# Patient Record
Sex: Male | Born: 1985
Health system: Southern US, Community
[De-identification: ages and names within clinical notes are randomized; demographics above are authoritative.]

## PROBLEM LIST (undated history)

## (undated) DIAGNOSIS — C8590 Non-Hodgkin lymphoma, unspecified, unspecified site: Secondary | ICD-10-CM

## (undated) HISTORY — PX: TONSILLECTOMY: SUR1361

---
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
## 2005-01-31 ENCOUNTER — Emergency Department (HOSPITAL_COMMUNITY): Admission: EM | Admit: 2005-01-31 | Discharge: 2005-01-31 | Payer: Self-pay | Admitting: Emergency Medicine

## 2005-03-08 ENCOUNTER — Encounter (HOSPITAL_COMMUNITY): Admission: RE | Admit: 2005-03-08 | Discharge: 2005-04-07 | Payer: Self-pay | Admitting: Family Medicine

## 2005-04-12 ENCOUNTER — Encounter (HOSPITAL_COMMUNITY): Admission: RE | Admit: 2005-04-12 | Discharge: 2005-05-12 | Payer: Self-pay | Admitting: Family Medicine

## 2010-06-04 ENCOUNTER — Encounter: Admission: RE | Admit: 2010-06-04 | Discharge: 2010-06-04 | Payer: Self-pay | Admitting: Otolaryngology

## 2010-06-29 ENCOUNTER — Ambulatory Visit: Payer: Self-pay | Admitting: Oncology

## 2010-07-01 LAB — COMPREHENSIVE METABOLIC PANEL
BUN: 14 mg/dL (ref 6–23)
CO2: 29 mEq/L (ref 19–32)
Calcium: 9.6 mg/dL (ref 8.4–10.5)
Chloride: 102 mEq/L (ref 96–112)
Creatinine, Ser: 1 mg/dL (ref 0.40–1.50)
Glucose, Bld: 81 mg/dL (ref 70–99)
Total Bilirubin: 0.5 mg/dL (ref 0.3–1.2)

## 2010-07-01 LAB — CBC WITH DIFFERENTIAL/PLATELET
Basophils Absolute: 0 10*3/uL (ref 0.0–0.1)
Eosinophils Absolute: 0.1 10*3/uL (ref 0.0–0.5)
HCT: 41.2 % (ref 38.4–49.9)
HGB: 14.4 g/dL (ref 13.0–17.1)
LYMPH%: 18.5 % (ref 14.0–49.0)
MCHC: 35 g/dL (ref 32.0–36.0)
MONO#: 0.5 10*3/uL (ref 0.1–0.9)
NEUT%: 70.5 % (ref 39.0–75.0)
Platelets: 281 10*3/uL (ref 140–400)
WBC: 5.8 10*3/uL (ref 4.0–10.3)
lymph#: 1.1 10*3/uL (ref 0.9–3.3)

## 2010-07-01 LAB — LACTATE DEHYDROGENASE: LDH: 140 U/L (ref 94–250)

## 2010-07-01 LAB — C-REACTIVE PROTEIN: CRP: 0.3 mg/dL (ref <0.6)

## 2010-07-09 ENCOUNTER — Ambulatory Visit (HOSPITAL_COMMUNITY): Admission: RE | Admit: 2010-07-09 | Discharge: 2010-07-09 | Payer: Self-pay | Admitting: Oncology

## 2010-07-12 ENCOUNTER — Ambulatory Visit (HOSPITAL_COMMUNITY): Admission: RE | Admit: 2010-07-12 | Discharge: 2010-07-12 | Payer: Self-pay | Admitting: Oncology

## 2010-07-13 ENCOUNTER — Ambulatory Visit: Admission: RE | Admit: 2010-07-13 | Discharge: 2010-07-13 | Payer: Self-pay | Admitting: Oncology

## 2010-07-13 ENCOUNTER — Encounter: Payer: Self-pay | Admitting: Oncology

## 2010-07-16 LAB — CBC WITH DIFFERENTIAL/PLATELET
Basophils Absolute: 0 10*3/uL (ref 0.0–0.1)
EOS%: 1.3 % (ref 0.0–7.0)
Eosinophils Absolute: 0.1 10*3/uL (ref 0.0–0.5)
HGB: 15.7 g/dL (ref 13.0–17.1)
MCV: 91.5 fL (ref 79.3–98.0)
MONO%: 8.7 % (ref 0.0–14.0)
NEUT#: 7.3 10*3/uL — ABNORMAL HIGH (ref 1.5–6.5)
RBC: 4.97 10*6/uL (ref 4.20–5.82)
RDW: 13.9 % (ref 11.0–14.6)
lymph#: 1.2 10*3/uL (ref 0.9–3.3)

## 2010-07-16 LAB — COMPREHENSIVE METABOLIC PANEL
AST: 53 U/L — ABNORMAL HIGH (ref 0–37)
Albumin: 4.8 g/dL (ref 3.5–5.2)
Alkaline Phosphatase: 59 U/L (ref 39–117)
BUN: 16 mg/dL (ref 6–23)
Calcium: 10.1 mg/dL (ref 8.4–10.5)
Chloride: 101 mEq/L (ref 96–112)
Glucose, Bld: 70 mg/dL (ref 70–99)
Potassium: 4.6 mEq/L (ref 3.5–5.3)
Sodium: 140 mEq/L (ref 135–145)
Total Protein: 7.2 g/dL (ref 6.0–8.3)

## 2010-07-26 ENCOUNTER — Encounter: Payer: Self-pay | Admitting: Oncology

## 2010-07-26 ENCOUNTER — Inpatient Hospital Stay (HOSPITAL_COMMUNITY): Admission: RE | Admit: 2010-07-26 | Discharge: 2010-08-02 | Payer: Self-pay | Admitting: Internal Medicine

## 2010-07-26 ENCOUNTER — Ambulatory Visit: Payer: Self-pay | Admitting: Vascular Surgery

## 2010-07-27 ENCOUNTER — Ambulatory Visit: Payer: Self-pay | Admitting: Oncology

## 2010-07-29 ENCOUNTER — Ambulatory Visit: Payer: Self-pay | Admitting: Infectious Diseases

## 2010-07-29 ENCOUNTER — Ambulatory Visit: Payer: Self-pay | Admitting: Oncology

## 2010-08-02 ENCOUNTER — Encounter: Payer: Self-pay | Admitting: Infectious Diseases

## 2010-08-13 LAB — COMPREHENSIVE METABOLIC PANEL
ALT: 47 U/L (ref 0–53)
BUN: 9 mg/dL (ref 6–23)
CO2: 28 mEq/L (ref 19–32)
Creatinine, Ser: 0.73 mg/dL (ref 0.40–1.50)
Total Bilirubin: 0.6 mg/dL (ref 0.3–1.2)

## 2010-08-13 LAB — CBC WITH DIFFERENTIAL/PLATELET
BASO%: 1.1 % (ref 0.0–2.0)
Basophils Absolute: 0.1 10*3/uL (ref 0.0–0.1)
EOS%: 1.5 % (ref 0.0–7.0)
HCT: 34.1 % — ABNORMAL LOW (ref 38.4–49.9)
LYMPH%: 33.5 % (ref 14.0–49.0)
MCH: 32.1 pg (ref 27.2–33.4)
MCHC: 34.8 g/dL (ref 32.0–36.0)
MONO#: 0.5 10*3/uL (ref 0.1–0.9)
NEUT%: 55.8 % (ref 39.0–75.0)
Platelets: 315 10*3/uL (ref 140–400)

## 2010-08-16 DIAGNOSIS — R740 Nonspecific elevation of levels of transaminase and lactic acid dehydrogenase [LDH]: Secondary | ICD-10-CM

## 2010-08-16 DIAGNOSIS — A419 Sepsis, unspecified organism: Secondary | ICD-10-CM | POA: Insufficient documentation

## 2010-08-16 DIAGNOSIS — R7401 Elevation of levels of liver transaminase levels: Secondary | ICD-10-CM | POA: Insufficient documentation

## 2010-08-16 DIAGNOSIS — M25519 Pain in unspecified shoulder: Secondary | ICD-10-CM | POA: Insufficient documentation

## 2010-08-18 ENCOUNTER — Encounter: Payer: Self-pay | Admitting: Infectious Diseases

## 2010-08-19 ENCOUNTER — Ambulatory Visit: Payer: Self-pay | Admitting: Infectious Diseases

## 2010-08-19 ENCOUNTER — Encounter (INDEPENDENT_AMBULATORY_CARE_PROVIDER_SITE_OTHER): Payer: Self-pay | Admitting: *Deleted

## 2010-08-20 ENCOUNTER — Encounter: Payer: Self-pay | Admitting: Infectious Diseases

## 2010-08-20 LAB — CBC WITH DIFFERENTIAL/PLATELET
Basophils Absolute: 0 10*3/uL (ref 0.0–0.1)
EOS%: 2.4 % (ref 0.0–7.0)
HCT: 34.9 % — ABNORMAL LOW (ref 38.4–49.9)
HGB: 12.3 g/dL — ABNORMAL LOW (ref 13.0–17.1)
MCH: 32.3 pg (ref 27.2–33.4)
MCV: 92 fL (ref 79.3–98.0)
MONO%: 9 % (ref 0.0–14.0)
NEUT%: 45.5 % (ref 39.0–75.0)

## 2010-08-20 LAB — COMPREHENSIVE METABOLIC PANEL
AST: 26 U/L (ref 0–37)
Alkaline Phosphatase: 90 U/L (ref 39–117)
BUN: 11 mg/dL (ref 6–23)
Creatinine, Ser: 0.87 mg/dL (ref 0.40–1.50)
Total Bilirubin: 0.6 mg/dL (ref 0.3–1.2)

## 2010-08-23 ENCOUNTER — Encounter: Payer: Self-pay | Admitting: Internal Medicine

## 2010-09-01 ENCOUNTER — Ambulatory Visit: Payer: Self-pay | Admitting: Oncology

## 2010-09-03 LAB — COMPREHENSIVE METABOLIC PANEL
ALT: 36 U/L (ref 0–53)
BUN: 9 mg/dL (ref 6–23)
CO2: 31 mEq/L (ref 19–32)
Calcium: 9.6 mg/dL (ref 8.4–10.5)
Chloride: 104 mEq/L (ref 96–112)
Creatinine, Ser: 0.84 mg/dL (ref 0.40–1.50)

## 2010-09-03 LAB — CBC WITH DIFFERENTIAL/PLATELET
Basophils Absolute: 0 10*3/uL (ref 0.0–0.1)
EOS%: 4.1 % (ref 0.0–7.0)
Eosinophils Absolute: 0.1 10*3/uL (ref 0.0–0.5)
LYMPH%: 57 % — ABNORMAL HIGH (ref 14.0–49.0)
MCH: 32.9 pg (ref 27.2–33.4)
MCV: 93.6 fL (ref 79.3–98.0)
MONO%: 18.6 % — ABNORMAL HIGH (ref 0.0–14.0)
NEUT#: 0.5 10*3/uL — ABNORMAL LOW (ref 1.5–6.5)
Platelets: 266 10*3/uL (ref 140–400)
RBC: 4.04 10*6/uL — ABNORMAL LOW (ref 4.20–5.82)
RDW: 16.5 % — ABNORMAL HIGH (ref 11.0–14.6)
WBC: 2.6 10*3/uL — ABNORMAL LOW (ref 4.0–10.3)

## 2010-09-24 LAB — CBC WITH DIFFERENTIAL/PLATELET
BASO%: 0.6 % (ref 0.0–2.0)
EOS%: 1.5 % (ref 0.0–7.0)
MCH: 33 pg (ref 27.2–33.4)
MCV: 94.2 fL (ref 79.3–98.0)
MONO%: 8.8 % (ref 0.0–14.0)
RBC: 4.26 10*6/uL (ref 4.20–5.82)
RDW: 14.8 % — ABNORMAL HIGH (ref 11.0–14.6)
lymph#: 1.7 10*3/uL (ref 0.9–3.3)

## 2010-09-24 LAB — COMPREHENSIVE METABOLIC PANEL
ALT: 24 U/L (ref 0–53)
AST: 26 U/L (ref 0–37)
Albumin: 4.3 g/dL (ref 3.5–5.2)
Alkaline Phosphatase: 43 U/L (ref 39–117)
Chloride: 106 mEq/L (ref 96–112)
Potassium: 4.1 mEq/L (ref 3.5–5.3)
Sodium: 141 mEq/L (ref 135–145)
Total Protein: 6.9 g/dL (ref 6.0–8.3)

## 2010-10-04 ENCOUNTER — Ambulatory Visit: Payer: Self-pay | Admitting: Oncology

## 2010-10-05 LAB — CBC WITH DIFFERENTIAL/PLATELET
BASO%: 0.8 % (ref 0.0–2.0)
Basophils Absolute: 0 10*3/uL (ref 0.0–0.1)
EOS%: 1.6 % (ref 0.0–7.0)
HGB: 12.8 g/dL — ABNORMAL LOW (ref 13.0–17.1)
MCH: 33.6 pg — ABNORMAL HIGH (ref 27.2–33.4)
MCHC: 35.7 g/dL (ref 32.0–36.0)
MCV: 94.2 fL (ref 79.3–98.0)
MONO%: 10.2 % (ref 0.0–14.0)
RDW: 13.5 % (ref 11.0–14.6)

## 2010-10-05 LAB — COMPREHENSIVE METABOLIC PANEL
ALT: 22 U/L (ref 0–53)
AST: 25 U/L (ref 0–37)
Albumin: 4.2 g/dL (ref 3.5–5.2)
Alkaline Phosphatase: 38 U/L — ABNORMAL LOW (ref 39–117)
BUN: 8 mg/dL (ref 6–23)
Creatinine, Ser: 0.9 mg/dL (ref 0.40–1.50)
Potassium: 3.8 mEq/L (ref 3.5–5.3)

## 2010-10-22 LAB — COMPREHENSIVE METABOLIC PANEL
ALT: 28 U/L (ref 0–53)
AST: 31 U/L (ref 0–37)
Albumin: 4.6 g/dL (ref 3.5–5.2)
Alkaline Phosphatase: 44 U/L (ref 39–117)
BUN: 10 mg/dL (ref 6–23)
CO2: 28 mEq/L (ref 19–32)
Calcium: 9.7 mg/dL (ref 8.4–10.5)
Chloride: 105 mEq/L (ref 96–112)
Creatinine, Ser: 1.02 mg/dL (ref 0.40–1.50)
Glucose, Bld: 71 mg/dL (ref 70–99)
Potassium: 4.4 mEq/L (ref 3.5–5.3)
Sodium: 143 mEq/L (ref 135–145)
Total Bilirubin: 0.6 mg/dL (ref 0.3–1.2)
Total Protein: 6.8 g/dL (ref 6.0–8.3)

## 2010-10-22 LAB — CBC WITH DIFFERENTIAL/PLATELET
BASO%: 3 % — ABNORMAL HIGH (ref 0.0–2.0)
Basophils Absolute: 0.1 10*3/uL (ref 0.0–0.1)
EOS%: 3 % (ref 0.0–7.0)
Eosinophils Absolute: 0.1 10*3/uL (ref 0.0–0.5)
HCT: 38.9 % (ref 38.4–49.9)
HGB: 14 g/dL (ref 13.0–17.1)
LYMPH%: 43.6 % (ref 14.0–49.0)
MCH: 32.3 pg (ref 27.2–33.4)
MCHC: 36 g/dL (ref 32.0–36.0)
MCV: 89.6 fL (ref 79.3–98.0)
MONO#: 0.8 10*3/uL (ref 0.1–0.9)
MONO%: 26.4 % — ABNORMAL HIGH (ref 0.0–14.0)
NEUT#: 0.7 10*3/uL — ABNORMAL LOW (ref 1.5–6.5)
NEUT%: 24 % — ABNORMAL LOW (ref 39.0–75.0)
Platelets: 272 10*3/uL (ref 140–400)
RBC: 4.34 10*6/uL (ref 4.20–5.82)
RDW: 13.9 % (ref 11.0–14.6)
WBC: 3 10*3/uL — ABNORMAL LOW (ref 4.0–10.3)
lymph#: 1.3 10*3/uL (ref 0.9–3.3)

## 2010-10-28 ENCOUNTER — Ambulatory Visit
Admission: RE | Admit: 2010-10-28 | Discharge: 2010-11-16 | Payer: Self-pay | Source: Home / Self Care | Attending: Radiation Oncology | Admitting: Radiation Oncology

## 2010-11-05 ENCOUNTER — Ambulatory Visit (HOSPITAL_COMMUNITY)
Admission: RE | Admit: 2010-11-05 | Discharge: 2010-11-05 | Payer: Self-pay | Source: Home / Self Care | Attending: Radiation Oncology | Admitting: Radiation Oncology

## 2010-11-05 ENCOUNTER — Ambulatory Visit (HOSPITAL_COMMUNITY): Admission: RE | Admit: 2010-11-05 | Payer: Self-pay | Source: Home / Self Care | Admitting: Internal Medicine

## 2010-11-16 NOTE — Miscellaneous (Signed)
Summary: HIPAA Restrictions  HIPAA Restrictions   Imported By: Florinda Marker 08/20/2010 09:37:22  _____________________________________________________________________  External Attachment:    Type:   Image     Comment:   External Document

## 2010-11-16 NOTE — Miscellaneous (Signed)
Summary: Problems, Medications and Allergies updated  Clinical Lists Changes  Problems: Added new problem of HODGKIN'S LYMPHOMA, HX OF (ICD-V10.72) Added new problem of SEPSIS (ICD-995.91) Added new problem of SHOULDER PAIN (ICD-719.41) Added new problem of TRANSAMINASES, SERUM, ELEVATED (ICD-790.4) Medications: Added new medication of VANCOMYCIN HCL 1000 MG SOLR (VANCOMYCIN HCL) per protocol Added new medication of ZOSYN 2-0.25 GM SOLR (PIPERACILLIN SOD-TAZOBACTAM SO) per protocol Observations: Added new observation of NKA: T (08/19/2010 10:12)

## 2010-11-16 NOTE — Miscellaneous (Signed)
Summary: [facility]: Verbal Orders  [facility]: Verbal Orders   Imported By: Florinda Marker 08/25/2010 15:57:56  _____________________________________________________________________  External Attachment:    Type:   Image     Comment:   External Document

## 2010-11-17 ENCOUNTER — Ambulatory Visit: Payer: Self-pay | Admitting: Radiation Oncology

## 2010-11-19 ENCOUNTER — Ambulatory Visit: Payer: 59 | Attending: Radiation Oncology | Admitting: Radiation Oncology

## 2010-11-19 DIAGNOSIS — K209 Esophagitis, unspecified without bleeding: Secondary | ICD-10-CM | POA: Insufficient documentation

## 2010-11-19 DIAGNOSIS — Z510 Encounter for antineoplastic radiation therapy: Secondary | ICD-10-CM | POA: Insufficient documentation

## 2010-11-19 DIAGNOSIS — C8198 Hodgkin lymphoma, unspecified, lymph nodes of multiple sites: Secondary | ICD-10-CM | POA: Insufficient documentation

## 2010-11-19 DIAGNOSIS — R609 Edema, unspecified: Secondary | ICD-10-CM | POA: Insufficient documentation

## 2010-11-19 NOTE — Medication Information (Signed)
Summary: [facility]: RX  [facility]: RX   Imported By: Florinda Marker 08/10/2010 12:04:56  _____________________________________________________________________  External Attachment:    Type:   Image     Comment:   External Document

## 2010-12-29 LAB — COMPREHENSIVE METABOLIC PANEL
ALT: 116 U/L — ABNORMAL HIGH (ref 0–53)
AST: 122 U/L — ABNORMAL HIGH (ref 0–37)
AST: 97 U/L — ABNORMAL HIGH (ref 0–37)
Albumin: 2.1 g/dL — ABNORMAL LOW (ref 3.5–5.2)
BUN: 12 mg/dL (ref 6–23)
CO2: 28 mEq/L (ref 19–32)
Calcium: 8.7 mg/dL (ref 8.4–10.5)
Calcium: 8.8 mg/dL (ref 8.4–10.5)
Chloride: 103 mEq/L (ref 96–112)
Creatinine, Ser: 0.91 mg/dL (ref 0.4–1.5)
GFR calc Af Amer: >60 mL/min (ref >60)
GFR calc Af Amer: >60 mL/min (ref >60)
GFR calc non Af Amer: >60 mL/min (ref >60)
Potassium: 4.2 mEq/L (ref 3.5–5.1)
Sodium: 138 mEq/L (ref 135–145)
Total Protein: 6.4 g/dL (ref 6.0–8.3)

## 2010-12-29 LAB — DIFFERENTIAL
Basophils Absolute: 0.1 10*3/uL (ref 0.0–0.1)
Eosinophils Absolute: 0.1 10*3/uL (ref 0.0–0.7)
Eosinophils Relative: 1 % (ref 0–5)
Lymphocytes Relative: 16 % (ref 12–46)
Lymphs Abs: 1.2 10*3/uL (ref 0.7–4.0)
Lymphs Abs: 1.6 10*3/uL (ref 0.7–4.0)
Monocytes Absolute: 0.9 10*3/uL (ref 0.1–1.0)
Monocytes Relative: 10 % (ref 3–12)
Monocytes Relative: 9 % (ref 3–12)
Neutro Abs: 7.5 10*3/uL (ref 1.7–7.7)

## 2010-12-29 LAB — CBC
Hemoglobin: 11.2 g/dL — ABNORMAL LOW (ref 13.0–17.0)
MCH: 31.8 pg (ref 26.0–34.0)
MCHC: 34.6 g/dL (ref 30.0–36.0)
MCV: 92.5 fL (ref 78.0–100.0)
Platelets: 506 10*3/uL — ABNORMAL HIGH (ref 150–400)
RBC: 3.49 MIL/uL — ABNORMAL LOW (ref 4.22–5.81)
RDW: 13.9 % (ref 11.5–15.5)
WBC: 7.7 10*3/uL (ref 4.0–10.5)

## 2010-12-29 LAB — MAGNESIUM
Magnesium: 2.2 mg/dL (ref 1.5–2.5)
Magnesium: 2.2 mg/dL (ref 1.5–2.5)

## 2010-12-29 LAB — HEPATITIS PANEL, ACUTE: Hep A IgM: NEGATIVE

## 2010-12-29 LAB — HEPATIC FUNCTION PANEL
ALT: 105 U/L — ABNORMAL HIGH (ref 0–53)
AST: 94 U/L — ABNORMAL HIGH (ref 0–37)
Total Protein: 6.2 g/dL (ref 6.0–8.3)

## 2010-12-30 LAB — COMPREHENSIVE METABOLIC PANEL
ALT: 106 U/L — ABNORMAL HIGH (ref 0–53)
ALT: 92 U/L — ABNORMAL HIGH (ref 0–53)
ALT: 94 U/L — ABNORMAL HIGH (ref 0–53)
AST: 128 U/L — ABNORMAL HIGH (ref 0–37)
AST: 78 U/L — ABNORMAL HIGH (ref 0–37)
AST: 88 U/L — ABNORMAL HIGH (ref 0–37)
AST: 94 U/L — ABNORMAL HIGH (ref 0–37)
Albumin: 1.9 g/dL — ABNORMAL LOW (ref 3.5–5.2)
Albumin: 2.3 g/dL — ABNORMAL LOW (ref 3.5–5.2)
Alkaline Phosphatase: 102 U/L (ref 39–117)
Alkaline Phosphatase: 109 U/L (ref 39–117)
Alkaline Phosphatase: 119 U/L — ABNORMAL HIGH (ref 39–117)
Alkaline Phosphatase: 133 U/L — ABNORMAL HIGH (ref 39–117)
BUN: 10 mg/dL (ref 6–23)
BUN: 21 mg/dL (ref 6–23)
CO2: 26 mEq/L (ref 19–32)
CO2: 27 mEq/L (ref 19–32)
CO2: 32 mEq/L (ref 19–32)
Calcium: 8.1 mg/dL — ABNORMAL LOW (ref 8.4–10.5)
Calcium: 8.1 mg/dL — ABNORMAL LOW (ref 8.4–10.5)
Calcium: 8.2 mg/dL — ABNORMAL LOW (ref 8.4–10.5)
Calcium: 8.3 mg/dL — ABNORMAL LOW (ref 8.4–10.5)
Chloride: 100 mEq/L (ref 96–112)
Chloride: 101 mEq/L (ref 96–112)
Chloride: 102 mEq/L (ref 96–112)
Chloride: 89 mEq/L — ABNORMAL LOW (ref 96–112)
Creatinine, Ser: 0.92 mg/dL (ref 0.4–1.5)
Creatinine, Ser: 1.24 mg/dL (ref 0.4–1.5)
GFR calc Af Amer: >60 mL/min (ref >60)
GFR calc Af Amer: >60 mL/min (ref >60)
GFR calc Af Amer: >60 mL/min (ref >60)
GFR calc Af Amer: >60 mL/min (ref >60)
GFR calc non Af Amer: >60 mL/min (ref >60)
GFR calc non Af Amer: >60 mL/min (ref >60)
GFR calc non Af Amer: >60 mL/min (ref >60)
Glucose, Bld: 104 mg/dL — ABNORMAL HIGH (ref 70–99)
Glucose, Bld: 114 mg/dL — ABNORMAL HIGH (ref 70–99)
Potassium: 3.4 mEq/L — ABNORMAL LOW (ref 3.5–5.1)
Potassium: 4 mEq/L (ref 3.5–5.1)
Potassium: 4.5 mEq/L (ref 3.5–5.1)
Sodium: 131 mEq/L — ABNORMAL LOW (ref 135–145)
Sodium: 134 mEq/L — ABNORMAL LOW (ref 135–145)
Sodium: 137 mEq/L (ref 135–145)
Total Bilirubin: 1.4 mg/dL — ABNORMAL HIGH (ref 0.3–1.2)
Total Bilirubin: 1.7 mg/dL — ABNORMAL HIGH (ref 0.3–1.2)
Total Bilirubin: 1.9 mg/dL — ABNORMAL HIGH (ref 0.3–1.2)
Total Bilirubin: 2.4 mg/dL — ABNORMAL HIGH (ref 0.3–1.2)
Total Protein: 5.8 g/dL — ABNORMAL LOW (ref 6.0–8.3)

## 2010-12-30 LAB — CULTURE, BLOOD (ROUTINE X 2)

## 2010-12-30 LAB — CBC
HCT: 29.3 % — ABNORMAL LOW (ref 39.0–52.0)
HCT: 29.4 % — ABNORMAL LOW (ref 39.0–52.0)
HCT: 37.1 % — ABNORMAL LOW (ref 39.0–52.0)
Hemoglobin: 10.3 g/dL — ABNORMAL LOW (ref 13.0–17.0)
Hemoglobin: 10.3 g/dL — ABNORMAL LOW (ref 13.0–17.0)
Hemoglobin: 12.9 g/dL — ABNORMAL LOW (ref 13.0–17.0)
MCH: 32.1 pg (ref 26.0–34.0)
MCHC: 34.8 g/dL (ref 30.0–36.0)
MCHC: 35 g/dL (ref 30.0–36.0)
MCHC: 35 g/dL (ref 30.0–36.0)
MCV: 91.6 fL (ref 78.0–100.0)
MCV: 91.7 fL (ref 78.0–100.0)
Platelets: 224 10*3/uL (ref 150–400)
Platelets: 254 10*3/uL (ref 150–400)
Platelets: 417 10*3/uL — ABNORMAL HIGH (ref 150–400)
RBC: 3.16 MIL/uL — ABNORMAL LOW (ref 4.22–5.81)
RBC: 3.21 MIL/uL — ABNORMAL LOW (ref 4.22–5.81)
RBC: 3.23 MIL/uL — ABNORMAL LOW (ref 4.22–5.81)
RBC: 4.59 MIL/uL (ref 4.22–5.81)
RDW: 13.4 % (ref 11.5–15.5)
RDW: 13.5 % (ref 11.5–15.5)
RDW: 13.6 % (ref 11.5–15.5)
WBC: 6.7 10*3/uL (ref 4.0–10.5)
WBC: 8.9 10*3/uL (ref 4.0–10.5)
WBC: 9.3 10*3/uL (ref 4.0–10.5)
WBC: 9.5 10*3/uL (ref 4.0–10.5)

## 2010-12-30 LAB — DIFFERENTIAL
Band Neutrophils: 0 % (ref 0–10)
Basophils Relative: 0 % (ref 0–1)
Basophils Relative: 0 % (ref 0–1)
Blasts: 0 %
Eosinophils Absolute: 0 10*3/uL (ref 0.0–0.7)
Eosinophils Absolute: 0.1 10*3/uL (ref 0.0–0.7)
Eosinophils Relative: 0 % (ref 0–5)
Eosinophils Relative: 0 % (ref 0–5)
Eosinophils Relative: 1 % (ref 0–5)
Lymphocytes Relative: 12 % (ref 12–46)
Lymphocytes Relative: 15 % (ref 12–46)
Lymphs Abs: 0.9 10*3/uL (ref 0.7–4.0)
Lymphs Abs: 1.1 10*3/uL (ref 0.7–4.0)
Lymphs Abs: 1.4 10*3/uL (ref 0.7–4.0)
Metamyelocytes Relative: 0 %
Monocytes Absolute: 2.5 10*3/uL — ABNORMAL HIGH (ref 0.1–1.0)
Monocytes Relative: 23 % — ABNORMAL HIGH (ref 3–12)
Monocytes Relative: 26 % — ABNORMAL HIGH (ref 3–12)
Neutro Abs: 5.8 10*3/uL (ref 1.7–7.7)
WBC Morphology: INCREASED
nRBC: 0 /100 WBC

## 2010-12-30 LAB — URINALYSIS, ROUTINE W REFLEX MICROSCOPIC
Glucose, UA: NEGATIVE mg/dL
Specific Gravity, Urine: 1.025 (ref 1.005–1.030)
pH: 5.5 (ref 5.0–8.0)

## 2010-12-30 LAB — GLUCOSE, CAPILLARY
Glucose-Capillary: 94 mg/dL (ref 70–99)
Glucose-Capillary: 98 mg/dL (ref 70–99)

## 2010-12-30 LAB — URINE MICROSCOPIC-ADD ON

## 2010-12-30 LAB — CARDIAC PANEL(CRET KIN+CKTOT+MB+TROPI)
CK, MB: 0.9 ng/mL (ref 0.3–4.0)
CK, MB: 1.5 ng/mL (ref 0.3–4.0)
Total CK: 67 U/L (ref 7–232)
Troponin I: 0.03 ng/mL (ref 0.00–0.06)

## 2010-12-30 LAB — MRSA PCR SCREENING: MRSA by PCR: NEGATIVE

## 2010-12-30 LAB — PROTIME-INR
INR: 1.52 — ABNORMAL HIGH (ref 0.00–1.49)
INR: 1.54 — ABNORMAL HIGH (ref 0.00–1.49)
Prothrombin Time: 19.3 seconds — ABNORMAL HIGH (ref 11.6–15.2)

## 2010-12-30 LAB — CATH TIP CULTURE: Culture: >100

## 2010-12-30 LAB — DIC (DISSEMINATED INTRAVASCULAR COAGULATION)PANEL
Smear Review: NONE SEEN
aPTT: 46 seconds — ABNORMAL HIGH (ref 24–37)

## 2010-12-30 LAB — POCT CARDIAC MARKERS
CKMB, poc: <1 ng/mL — ABNORMAL LOW (ref 1.0–8.0)
Myoglobin, poc: 222 ng/mL (ref 12–200)
Troponin i, poc: <0.05 ng/mL (ref 0.00–0.09)

## 2010-12-30 LAB — VANCOMYCIN, TROUGH: Vancomycin Tr: 9.6 ug/mL — ABNORMAL LOW (ref 10.0–20.0)

## 2010-12-30 LAB — MAGNESIUM: Magnesium: 2.7 mg/dL — ABNORMAL HIGH (ref 1.5–2.5)

## 2011-01-28 ENCOUNTER — Ambulatory Visit: Payer: 59 | Attending: Radiation Oncology | Admitting: Radiation Oncology

## 2011-02-04 ENCOUNTER — Encounter (HOSPITAL_BASED_OUTPATIENT_CLINIC_OR_DEPARTMENT_OTHER): Payer: 59 | Admitting: Oncology

## 2011-02-04 ENCOUNTER — Other Ambulatory Visit: Payer: Self-pay | Admitting: Oncology

## 2011-02-04 DIAGNOSIS — C8121 Mixed cellularity classical Hodgkin lymphoma, lymph nodes of head, face, and neck: Secondary | ICD-10-CM

## 2011-02-04 DIAGNOSIS — B029 Zoster without complications: Secondary | ICD-10-CM

## 2011-02-04 DIAGNOSIS — C8198 Hodgkin lymphoma, unspecified, lymph nodes of multiple sites: Secondary | ICD-10-CM

## 2011-02-04 LAB — CBC WITH DIFFERENTIAL/PLATELET
BASO%: 1.3 % (ref 0.0–2.0)
Basophils Absolute: 0.1 10*3/uL (ref 0.0–0.1)
EOS%: 5.3 % (ref 0.0–7.0)
HGB: 14.9 g/dL (ref 13.0–17.1)
MCH: 32.5 pg (ref 27.2–33.4)
MCHC: 35.4 g/dL (ref 32.0–36.0)
MONO#: 0.4 10*3/uL (ref 0.1–0.9)
RDW: 13.4 % (ref 11.0–14.6)
WBC: 4 10*3/uL (ref 4.0–10.3)
lymph#: 0.6 10*3/uL — ABNORMAL LOW (ref 0.9–3.3)

## 2011-02-04 LAB — COMPREHENSIVE METABOLIC PANEL
ALT: 17 U/L (ref 0–53)
AST: 29 U/L (ref 0–37)
Albumin: 5 g/dL (ref 3.5–5.2)
CO2: 26 mEq/L (ref 19–32)
Calcium: 10.2 mg/dL (ref 8.4–10.5)
Chloride: 104 mEq/L (ref 96–112)
Potassium: 4.3 mEq/L (ref 3.5–5.3)

## 2011-05-06 ENCOUNTER — Other Ambulatory Visit: Payer: Self-pay | Admitting: Medical

## 2011-05-06 ENCOUNTER — Other Ambulatory Visit: Payer: Self-pay | Admitting: Oncology

## 2011-05-06 ENCOUNTER — Encounter (HOSPITAL_BASED_OUTPATIENT_CLINIC_OR_DEPARTMENT_OTHER): Payer: 59 | Admitting: Oncology

## 2011-05-06 DIAGNOSIS — C8190 Hodgkin lymphoma, unspecified, unspecified site: Secondary | ICD-10-CM

## 2011-05-06 DIAGNOSIS — C8121 Mixed cellularity classical Hodgkin lymphoma, lymph nodes of head, face, and neck: Secondary | ICD-10-CM

## 2011-05-06 DIAGNOSIS — C8198 Hodgkin lymphoma, unspecified, lymph nodes of multiple sites: Secondary | ICD-10-CM

## 2011-05-06 LAB — COMPREHENSIVE METABOLIC PANEL
ALT: 17 U/L (ref 0–53)
AST: 24 U/L (ref 0–37)
Calcium: 9.9 mg/dL (ref 8.4–10.5)
Chloride: 99 mEq/L (ref 96–112)
Creatinine, Ser: 1.12 mg/dL (ref 0.50–1.35)
Potassium: 4.6 mEq/L (ref 3.5–5.3)
Sodium: 142 mEq/L (ref 135–145)

## 2011-05-06 LAB — CBC WITH DIFFERENTIAL/PLATELET
BASO%: 0.8 % (ref 0.0–2.0)
EOS%: 2.4 % (ref 0.0–7.0)
MCH: 33.7 pg — ABNORMAL HIGH (ref 27.2–33.4)
MCHC: 36.1 g/dL — ABNORMAL HIGH (ref 32.0–36.0)
RBC: 4.58 10*6/uL (ref 4.20–5.82)
RDW: 12.5 % (ref 11.0–14.6)
lymph#: 0.7 10*3/uL — ABNORMAL LOW (ref 0.9–3.3)

## 2011-07-29 ENCOUNTER — Ambulatory Visit
Admission: RE | Admit: 2011-07-29 | Discharge: 2011-07-29 | Disposition: A | Discharge status: Home / Self Care | Payer: BC Managed Care – PPO | Source: Ambulatory Visit | Attending: Radiation Oncology | Admitting: Radiation Oncology

## 2011-08-05 ENCOUNTER — Encounter (HOSPITAL_BASED_OUTPATIENT_CLINIC_OR_DEPARTMENT_OTHER): Payer: BC Managed Care – PPO | Admitting: Oncology

## 2011-08-05 ENCOUNTER — Other Ambulatory Visit: Payer: Self-pay | Admitting: Oncology

## 2011-08-05 ENCOUNTER — Other Ambulatory Visit (HOSPITAL_COMMUNITY): Payer: 59

## 2011-08-05 DIAGNOSIS — C8198 Hodgkin lymphoma, unspecified, lymph nodes of multiple sites: Secondary | ICD-10-CM

## 2011-08-05 DIAGNOSIS — B029 Zoster without complications: Secondary | ICD-10-CM

## 2011-08-05 DIAGNOSIS — C8121 Mixed cellularity classical Hodgkin lymphoma, lymph nodes of head, face, and neck: Secondary | ICD-10-CM

## 2011-08-05 LAB — CBC WITH DIFFERENTIAL/PLATELET
Basophils Absolute: 0 10*3/uL (ref 0.0–0.1)
EOS%: 2 % (ref 0.0–7.0)
Eosinophils Absolute: 0.1 10*3/uL (ref 0.0–0.5)
HGB: 15.3 g/dL (ref 13.0–17.1)
MCV: 93.9 fL (ref 79.3–98.0)
MONO%: 13.2 % (ref 0.0–14.0)
NEUT#: 3 10*3/uL (ref 1.5–6.5)
RBC: 4.54 10*6/uL (ref 4.20–5.82)
RDW: 12.6 % (ref 11.0–14.6)
lymph#: 0.9 10*3/uL (ref 0.9–3.3)

## 2011-08-05 LAB — COMPREHENSIVE METABOLIC PANEL
AST: 29 U/L (ref 0–37)
Albumin: 4.3 g/dL (ref 3.5–5.2)
Alkaline Phosphatase: 49 U/L (ref 39–117)
Calcium: 9.8 mg/dL (ref 8.4–10.5)
Chloride: 103 mEq/L (ref 96–112)
Glucose, Bld: 72 mg/dL (ref 70–99)
Potassium: 3.9 mEq/L (ref 3.5–5.3)
Sodium: 139 mEq/L (ref 135–145)
Total Protein: 6.9 g/dL (ref 6.0–8.3)

## 2011-08-12 ENCOUNTER — Encounter (HOSPITAL_BASED_OUTPATIENT_CLINIC_OR_DEPARTMENT_OTHER): Payer: BC Managed Care – PPO | Admitting: Oncology

## 2011-08-12 DIAGNOSIS — C8198 Hodgkin lymphoma, unspecified, lymph nodes of multiple sites: Secondary | ICD-10-CM

## 2011-08-12 DIAGNOSIS — C8121 Mixed cellularity classical Hodgkin lymphoma, lymph nodes of head, face, and neck: Secondary | ICD-10-CM

## 2011-08-25 ENCOUNTER — Ambulatory Visit: Payer: BC Managed Care – PPO

## 2011-08-25 ENCOUNTER — Ambulatory Visit: Payer: BC Managed Care – PPO | Admitting: Physician Assistant

## 2011-10-27 ENCOUNTER — Telehealth: Payer: Self-pay | Admitting: Oncology

## 2011-10-27 NOTE — Telephone Encounter (Signed)
Returned pt's call re needing pet scan appt for 1/25. R/s scan that pt cx'd in oct. gv pt new d/t/instructions for 1/25 @ 9:15 am (tiffany).

## 2011-11-11 ENCOUNTER — Other Ambulatory Visit: Payer: Self-pay | Admitting: Oncology

## 2011-11-11 ENCOUNTER — Ambulatory Visit (HOSPITAL_COMMUNITY)
Admission: RE | Admit: 2011-11-11 | Discharge: 2011-11-11 | Disposition: A | Discharge status: Home / Self Care | Payer: BC Managed Care – PPO | Source: Ambulatory Visit | Attending: Oncology | Admitting: Oncology

## 2011-11-11 DIAGNOSIS — C8190 Hodgkin lymphoma, unspecified, unspecified site: Secondary | ICD-10-CM | POA: Insufficient documentation

## 2011-11-11 DIAGNOSIS — C8590 Non-Hodgkin lymphoma, unspecified, unspecified site: Secondary | ICD-10-CM

## 2011-11-11 DIAGNOSIS — R599 Enlarged lymph nodes, unspecified: Secondary | ICD-10-CM | POA: Insufficient documentation

## 2011-11-11 MED ORDER — FLUDEOXYGLUCOSE F - 18 (FDG) INJECTION
17.0000 | Freq: Once | INTRAVENOUS | Status: AC | PRN
  Administered 2011-11-11: 17 via INTRAVENOUS

## 2011-11-22 ENCOUNTER — Telehealth: Payer: Self-pay | Admitting: Oncology

## 2011-11-22 ENCOUNTER — Other Ambulatory Visit (HOSPITAL_BASED_OUTPATIENT_CLINIC_OR_DEPARTMENT_OTHER): Payer: BC Managed Care – PPO | Admitting: Lab

## 2011-11-22 ENCOUNTER — Ambulatory Visit (HOSPITAL_BASED_OUTPATIENT_CLINIC_OR_DEPARTMENT_OTHER): Payer: BC Managed Care – PPO | Admitting: Oncology

## 2011-11-22 VITALS — BP 133/86 | HR 65 | Temp 97.3°F | Ht 73.0 in | Wt 183.7 lb

## 2011-11-22 DIAGNOSIS — B029 Zoster without complications: Secondary | ICD-10-CM

## 2011-11-22 DIAGNOSIS — C8121 Mixed cellularity classical Hodgkin lymphoma, lymph nodes of head, face, and neck: Secondary | ICD-10-CM

## 2011-11-22 DIAGNOSIS — C8190 Hodgkin lymphoma, unspecified, unspecified site: Secondary | ICD-10-CM

## 2011-11-22 DIAGNOSIS — C8198 Hodgkin lymphoma, unspecified, lymph nodes of multiple sites: Secondary | ICD-10-CM

## 2011-11-22 LAB — CBC WITH DIFFERENTIAL/PLATELET
BASO%: 1 % (ref 0.0–2.0)
Eosinophils Absolute: 0.1 10*3/uL (ref 0.0–0.5)
LYMPH%: 29.8 % (ref 14.0–49.0)
MCHC: 35.5 g/dL (ref 32.0–36.0)
MONO#: 0.4 10*3/uL (ref 0.1–0.9)
NEUT#: 2.3 10*3/uL (ref 1.5–6.5)
RBC: 4.87 10*6/uL (ref 4.20–5.82)
RDW: 12.6 % (ref 11.0–14.6)
WBC: 4.1 10*3/uL (ref 4.0–10.3)
lymph#: 1.2 10*3/uL (ref 0.9–3.3)

## 2011-11-22 LAB — COMPREHENSIVE METABOLIC PANEL
AST: 30 U/L (ref 0–37)
Alkaline Phosphatase: 46 U/L (ref 39–117)
BUN: 15 mg/dL (ref 6–23)
CO2: 24 mEq/L (ref 19–32)
Calcium: 9.8 mg/dL (ref 8.4–10.5)
Creatinine, Ser: 1.06 mg/dL (ref 0.50–1.35)
Glucose, Bld: 85 mg/dL (ref 70–99)
Potassium: 4.3 mEq/L (ref 3.5–5.3)
Sodium: 138 mEq/L (ref 135–145)

## 2011-11-22 NOTE — Telephone Encounter (Signed)
gv pt appt for may2013 

## 2011-11-22 NOTE — Progress Notes (Signed)
Hematology and Oncology Follow Up Visit  Eric Watkins [id number] [birth year] 26 y.o. 11/22/2011 12:38 PM  CC: Eric Watkins, M.D.  [facility]    Principle Diagnosis: This is a 26 year old gentleman diagnosed with stage IIA Hodgkin disease.  He has good risk features diagnosed in August of 2011.   Prior Therapy: The patient treated with 2 cycles of ABVD followed by involved field radiation completed in March of 2012.  He received a total of 25.2 Gy at the cervical, supraclavicular, axillary lymph nodes.   Current therapy: Observation with surveillance.   Interim History:   Eric Watkins presents today for a follow-up visit.  He has continued to do very well without any evidence to suggest recurrent disease, is over 9 months from completion of his radiation therapy.  He had not reported any abdominal pain.  He had not reported any lymphadenopathy.  He had not reported any delayed ill effects associated with chemotherapy or radiation.  His performance status, activity level remain excellent.  He really had no delayed toxicity to speak of. He did report inguinal lymph nodes enlargement and and was given Keflex which he reports a decrease in the size of the left groin lymph node.  Medications: I have reviewed the patient's current medications. No current outpatient prescriptions on file.  Allergies: No Known Allergies  Past Medical History, Surgical history, Social history, and Family History were reviewed and updated.  Review of Systems: Constitutional:  Negative for fever, chills, night sweats, anorexia, weight loss, pain. Cardiovascular: no chest pain or dyspnea on exertion Respiratory: no cough, shortness of breath, or wheezing Neurological: no TIA or stroke symptoms Dermatological: negative ENT: negative Skin: Negative. Gastrointestinal: no abdominal pain, change in bowel habits, or black or bloody stools Genito-Urinary: no dysuria, trouble voiding, or  hematuria Hematological and Lymphatic: negative Breast: negative Musculoskeletal: negative Remaining ROS negative. Physical Exam: Blood pressure 133/86, pulse 65, temperature 97.3 F (36.3 C), temperature source Oral, height 6\' 1"  (1.854 m), weight 183 lb 11.2 oz (83.326 kg). ECOG:  General appearance: alert Head: Normocephalic, without obvious abnormality, atraumatic Neck: no adenopathy, no carotid bruit, no JVD, supple, symmetrical, trachea midline and thyroid not enlarged, symmetric, no tenderness/mass/nodules Lymph nodes: Cervical, supraclavicular, and axillary nodes normal. Inguinal lymph node was palpable (pea size) Non-tender. Heart:regular rate and rhythm, S1, S2 normal, no murmur, click, rub or gallop Lung:chest clear, no wheezing, rales, normal symmetric air entry, Heart exam - S1, S2 normal, no murmur, no gallop, rate regular Abdomin: soft, non-tender, without masses or organomegaly EXT:no erythema, induration, or nodules   Lab Results: Lab Results  Component Value Date   WBC 4.1 11/22/2011   HGB 16.2 11/22/2011   HCT 45.5 11/22/2011   MCV 93.4 11/22/2011   PLT 204 11/22/2011     Chemistry      Component Value Date/Time   NA 139 08/05/2011 1558   K 3.9 08/05/2011 1558   CL 103 08/05/2011 1558   CO2 28 08/05/2011 1558   BUN 13 08/05/2011 1558   CREATININE 1.18 08/05/2011 1558      Component Value Date/Time   CALCIUM 9.8 08/05/2011 1558   ALKPHOS 49 08/05/2011 1558   AST 29 08/05/2011 1558   ALT 19 08/05/2011 1558   BILITOT 0.4 08/05/2011 1558       Radiological Studies: Comparison: 11/05/2010; 05-Aug-2010  Findings: No residual pathologic adenopathy in the neck cord chest;  the prior brown fat hypermetabolic activity has resolved.  A right axillary lymph node measuring 0.9  cm in short axis does not  appear hypermetabolic.  There is abnormal hypermetabolic nodal activity in the pelvis  bilaterally in the external iliac and inguinal chains. A left  external iliac  node measuring 1.1 cm in short axis has a maximum  standard uptake value of 5.8. The left inguinal lymph node  measuring 1.4 cm in short axis has a maximum standard uptake value  of 9.7.  A right inguinal node measuring 0.8 cm in short axis has a maximum  standard uptake value of 5.8. Multiple additional small  hypermetabolic lymph nodes are present in this vicinity.  There is polypoid mucoperiosteal thickening in the left maxillary  sinus. Otherwise unremarkable exam.  IMPRESSION:  1. New hypermetabolic pelvic adenopathy compatible with malignancy.   Impression and Plan:  A 26 year old gentleman with the following issues: 1. Stage IIA Hodgkin disease.  He has good risk features, diagnosed in August 2011.  No evidence to suggest recurrent disease.  His last PET/CT scan was done in January of 2013. The results discussed with the reviewing radiologist as well as Eric Watkins. The differential diagnosis discussed with Eric Watkins: this can represent relapse of his lymphoma (the pattern is not classic for Hodgkin's disease) Vs. Infection (already improving with antibiotics). The plan is close observation, and if these lymph nodes are still palpable in the near future, then will biopsy them.   2. Methicillin-susceptible Staphylococcus aureus (MSSA) bacteremia.  That has resolved at this time, no residual effects.    Eric Hose, MD 2/5/201312:38 PM

## 2012-01-23 IMAGING — PT NM PET TUM IMG INITIAL (PI) SKULL BASE T - THIGH
6 series · 25 of 25 positions shown · non-contrast
Comparison: The neck CT of 06/04/2010

CLINICAL DATA: Initial treatment strategy for staging of lymphoma.
Recent right neck biopsy demonstrating Hodgkin's lymphoma.

NUCLEAR MEDICINE PET CT SKULL BASE TO THIGH
TECHNIQUE: 18.2 mCi F-18 FDG was injected intravenously via the
right AC.  Full-ring PET imaging was performed from the skull base
through the mid-thighs 68  minutes after injection.  CT data was
obtained and used for attenuation correction and anatomic
localization only.  (This was not acquired as a diagnostic CT
examination.)
Fasting Blood Glucose:  98

[Series 1: pet ac · axial · 3.3mm · 4.69mm/px · z∈[-309,-15]mm · 5 of 91 slices shown]
[im 1/91]
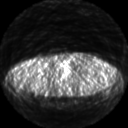
[im 23/91]
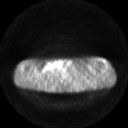
[im 46/91]
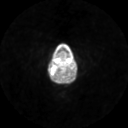
[im 68/91]
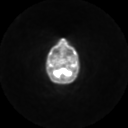
[im 91/91]
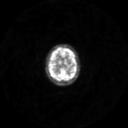

[Series 2: pet nac · axial · 3.3mm · 4.69mm/px · z∈[-309,-15]mm · 5 of 91 slices shown]
[im 1/91]
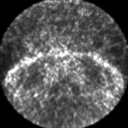
[im 23/91]
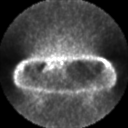
[im 46/91]
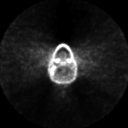
[im 68/91]
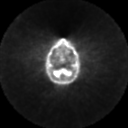
[im 91/91]
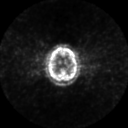

[Series 2: ct neck · axial · 3.8mm · 0.98mm/px · z∈[-309,-15]mm · 5 of 91 slices shown]
[im 1/91]
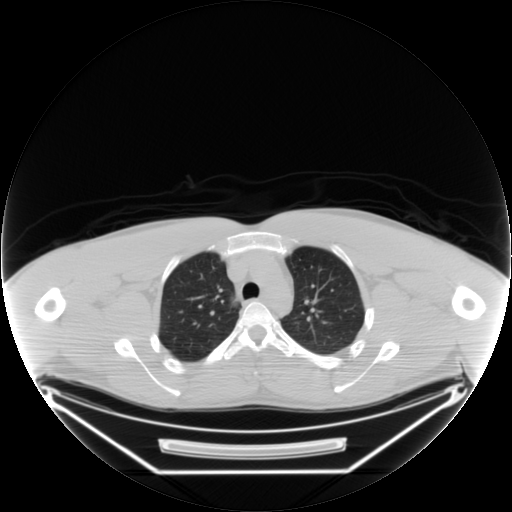
[im 23/91]
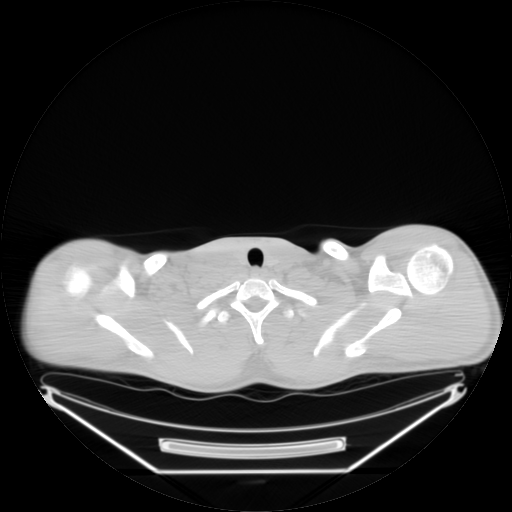
[im 46/91]
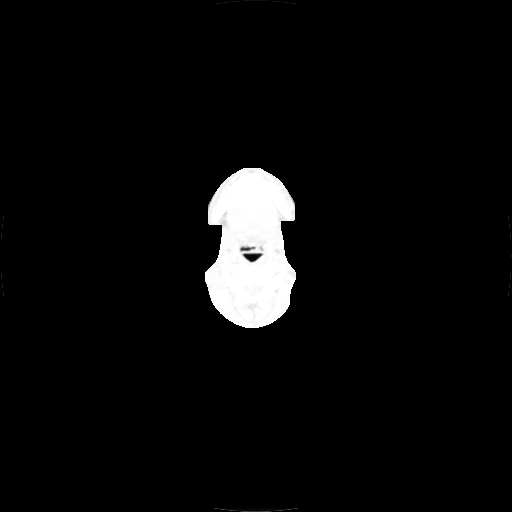
[im 68/91]
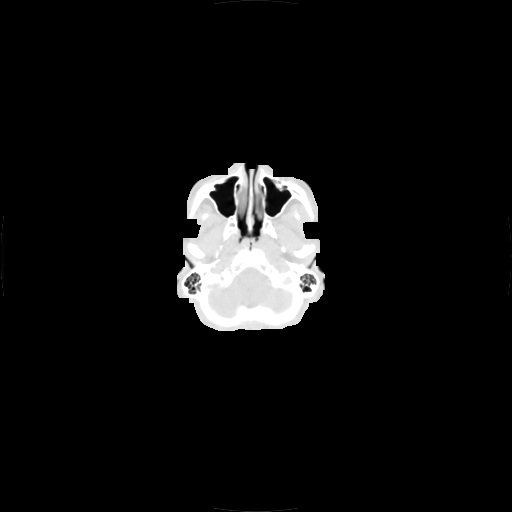
[im 91/91  brain]
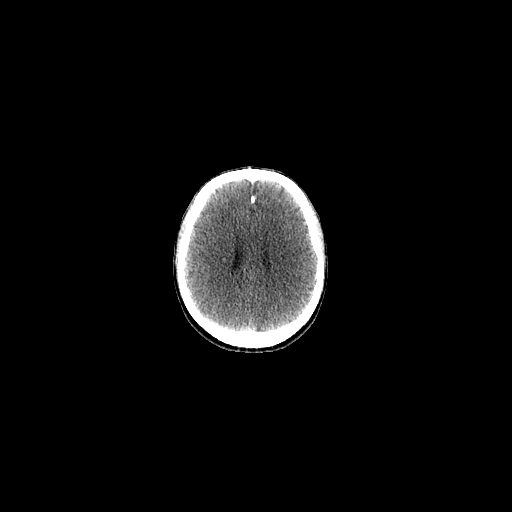

[Series 123: mip · coronal · 3.3mm · 4.69mm/px · 2 of 30 slices shown]
[im 1/30]
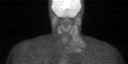
[im 30/30]
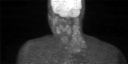

[Series 151: reformatted · axial · 3.3mm · 3.91mm/px · z∈[-309,-15]mm · 5 of 90 slices shown (1 of 2)]
[im 1/90]
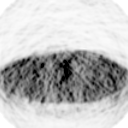
[im 23/90]
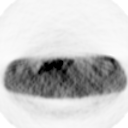
[im 45/90]
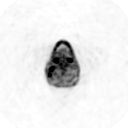
[im 67/90]
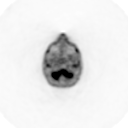
[im 90/90]
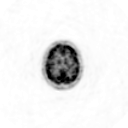

[Series 153: reformatted · coronal · 4.7mm · 4.69mm/px · 3 of 59 slices shown (2 of 2)]
[im 1/59]
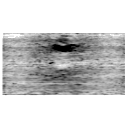
[im 30/59]
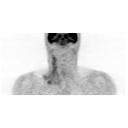
[im 59/59]
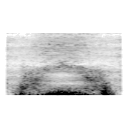

[25 of 25 positions shown; findings below may reference images not displayed]

FINDINGS: PET images demonstrate symmetric hypermetabolism within
the parotid and submandibular glands bilaterally.  Likely
physiologic.  Similarly, hypermetabolism within the palatine tonsil
region is without CT correlate and likely physiologic (rather than
representing involvement of Waldeyer's ring).

Extensive right-sided hypermetabolic cervical adenopathy.  A
conglomerate right-sided level II -III hypermetabolic nodal mass
measures 1.9 x 1.1 cm and a S.U.V. max of 5.6 on image 45 of series
2.  Extension into the right supraclavicular space.

A focus of hypermetabolism within the medial right thyroid gland
measures a S.U.V. max of 3.7.  No definite CT correlate.

Hypermetabolic adenopathy within the right axilla.  An index
conglomerate of nodes measures 1.7 x 3.3 cm and a S.U.V. max of
on image 74 of series 2.

No abnormal activity below the diaphragm. Mild low level
hypermetabolism corresponding to soft tissue in the anterior
mediastinum which is likely residual thymus.

CT images performed for attenuation correction demonstrate neck
findings which will be deferred to recent diagnostic CT.  No
abdominal or pelvic adenopathy.
IMPRESSION: 1.  Hypermetabolic disease within the right neck and right axilla.
No evidence of subdiaphragmatic disease.
2.  A focus of hypermetabolism within the right lobe of the thyroid
which is without definite CT correlate.  This could either be
reevaluated at follow-up PET or reevaluated with thyroid
ultrasound.

## 2012-01-26 IMAGING — US IR US GUIDE VASC ACCESS RIGHT
1 series · 1 of 1 positions shown · non-contrast
Comparison: none

CLINICAL DATA: Hodgkin's lymphoma.  The patient requires a Port-A-
Cath to begin chemotherapy.

[Series 1: sp us guide vasc access*right* · 1 of 1 slices shown]
[im 1/1]
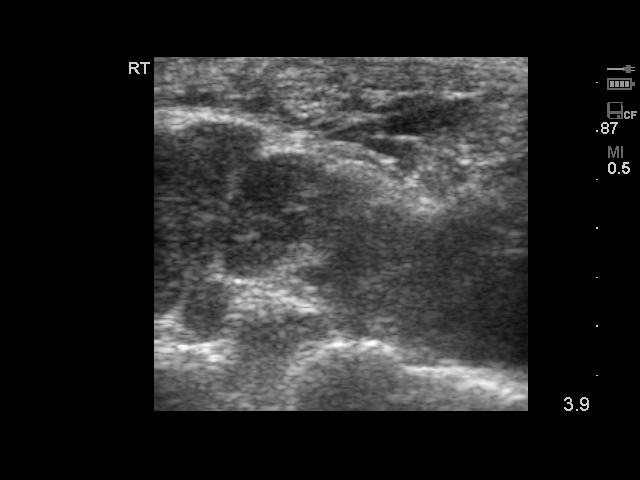

[1 of 1 positions shown; findings below may reference images not displayed]

IMPLANTED PORT A CATH PLACEMENT WITH ULTRASOUND AND FLUOROSCOPIC
GUIDANCE

Sedation:  4.0 mg IV Versed; 100 mcg IV Fentanyl.

Total Moderate Sedation Time:  45 minutes.

Additional Medications:  1 gram IV Ancef F.  IV antibiotic was
given in a appropriate time interval prior to skin puncture.

Fluoroscopy Time:  0.5 minutes.

Procedure:  The procedure, risks, benefits, and alternatives were
explained to the patient.  Questions regarding the procedure were
encouraged and answered.  The patient understands and consents to
the procedure.

The right neck and chest were prepped with chlorhexidine in a
sterile fashion, and a sterile drape was applied covering the
operative field.  Maximum barrier sterile technique with sterile
gowns and gloves were used for the procedure.  Local anesthesia was
provided with 1% lidocaine and lidocaine with epinephrine.

After creating a small venotomy incision, a 21 gauge needle was
advanced into the right internal jugular vein under direct, real-
time ultrasound guidance.  Ultrasound image documentation was
performed.  After securing guidewire access, an 8 Fr dilator was
placed.  A J-wire was kinked to measure appropriate catheter
length.

A subcutaneous port pocket was then created along the upper chest
wall utilizing sharp and blunt dissection.  Portable cautery was
utilized.  The pocket was irrigated with sterile saline.

A single lumen power injectable port was chosen for placement.  The
8 Fr catheter was tunneled from the port pocket site to the
venotomy incision.  The port was placed in the pocket and secured
with two Ethilon tacking sutures.  External catheter was trimmed to
appropriate length based on guidewire measurement.

At the venotomy, an 8 Fr peel-away sheath was placed over a
guidewire.  The catheter was then placed through the sheath and the
sheath removed.  Final catheter positioning was confirmed and
documented with a fluoroscopic spot image.  The port was accessed
with a needle and aspirated and flushed with heparinized saline.
The needle was removed.

The venotomy and port pocket incisions were closed with
subcutaneous 3-0 Monocryl and subcuticular 4-0 Vicryl.  Dermabond
was applied to both incisions.

Complications: None.  No pneumothorax.
FINDINGS: After catheter placement, the tip lies at the cavoatrial
junction.  The catheter aspirates normally and is ready for
immediate use.
IMPRESSION: Placement of single lumen port a cath via right internal jugular
vein.  The catheter tip lies at the cavoatrial junction.  A power
injectable port a cath was placed and is ready for immediate use.

## 2012-02-24 ENCOUNTER — Telehealth: Payer: Self-pay | Admitting: Oncology

## 2012-02-24 ENCOUNTER — Ambulatory Visit (HOSPITAL_BASED_OUTPATIENT_CLINIC_OR_DEPARTMENT_OTHER): Payer: BC Managed Care – PPO | Admitting: Oncology

## 2012-02-24 ENCOUNTER — Other Ambulatory Visit (HOSPITAL_BASED_OUTPATIENT_CLINIC_OR_DEPARTMENT_OTHER): Payer: BC Managed Care – PPO | Admitting: Lab

## 2012-02-24 VITALS — BP 133/85 | HR 62 | Temp 97.3°F | Ht 73.0 in | Wt 187.8 lb

## 2012-02-24 DIAGNOSIS — C8590 Non-Hodgkin lymphoma, unspecified, unspecified site: Secondary | ICD-10-CM

## 2012-02-24 DIAGNOSIS — C8589 Other specified types of non-Hodgkin lymphoma, extranodal and solid organ sites: Secondary | ICD-10-CM

## 2012-02-24 DIAGNOSIS — C8190 Hodgkin lymphoma, unspecified, unspecified site: Secondary | ICD-10-CM

## 2012-02-24 LAB — CBC WITH DIFFERENTIAL/PLATELET
Basophils Absolute: 0.1 10*3/uL (ref 0.0–0.1)
Eosinophils Absolute: 0.1 10*3/uL (ref 0.0–0.5)
HGB: 15.2 g/dL (ref 13.0–17.1)
MCV: 92.8 fL (ref 79.3–98.0)
MONO%: 13 % (ref 0.0–14.0)
NEUT#: 2.3 10*3/uL (ref 1.5–6.5)
RDW: 13 % (ref 11.0–14.6)

## 2012-02-24 LAB — COMPREHENSIVE METABOLIC PANEL
Albumin: 4.5 g/dL (ref 3.5–5.2)
CO2: 27 mEq/L (ref 19–32)
Calcium: 9.7 mg/dL (ref 8.4–10.5)
Chloride: 104 mEq/L (ref 96–112)
Glucose, Bld: 80 mg/dL (ref 70–99)
Potassium: 4 mEq/L (ref 3.5–5.3)
Sodium: 141 mEq/L (ref 135–145)
Total Bilirubin: 0.4 mg/dL (ref 0.3–1.2)
Total Protein: 7.3 g/dL (ref 6.0–8.3)

## 2012-02-24 LAB — LACTATE DEHYDROGENASE: LDH: 172 U/L (ref 94–250)

## 2012-02-24 NOTE — Telephone Encounter (Signed)
Gave pt appt for lab and PET Scan and MD visit in September

## 2012-02-24 NOTE — Progress Notes (Signed)
Hematology and Oncology Follow Up Visit  Eric Watkins [id number] Jun 21, 1986 26 y.o. 02/24/2012 4:19 PM  CC: Eric Watkins, M.D.  [facility]    Principle Diagnosis: This is a 26 year old gentleman diagnosed with stage IIA Hodgkin disease.  He has good risk features diagnosed in August of 2011.   Prior Therapy: The patient treated with 2 cycles of ABVD followed by involved field radiation completed in March of 2012.  He received a total of 25.2 Gy at the cervical, supraclavicular, axillary lymph nodes.   Current therapy: Observation with surveillance.   Interim History:   Eric Watkins presents today for a follow-up visit.  He has continued to do very well without any evidence to suggest recurrent disease, He is over 12 months from completion of his radiation therapy.  He had not reported any abdominal pain.  He had not reported any lymphadenopathy.  He had not reported any delayed ill effects associated with chemotherapy or radiation.  His performance status, activity level remain excellent.  He really had no delayed toxicity to speak of. He did not report any inguinal lymph nodes enlargement since his last noted infection in that area.  Medications: I have reviewed the patient's current medications. No current outpatient prescriptions on file.  Allergies: No Known Allergies  Past Medical History, Surgical history, Social history, and Family History were reviewed and updated.  Review of Systems: Constitutional:  Negative for fever, chills, night sweats, anorexia, weight loss, pain. Cardiovascular: no chest pain or dyspnea on exertion Respiratory: no cough, shortness of breath, or wheezing Neurological: no TIA or stroke symptoms Dermatological: negative ENT: negative Skin: Negative. Gastrointestinal: no abdominal pain, change in bowel habits, or black or bloody stools Genito-Urinary: no dysuria, trouble voiding, or hematuria Hematological and Lymphatic: negative Breast:  negative Musculoskeletal: negative Remaining ROS negative. Physical Exam: Blood pressure 133/85, pulse 62, temperature 97.3 F (36.3 C), temperature source Oral, height 6\' 1"  (1.854 m), weight 187 lb 12.8 oz (85.186 kg). ECOG: 0 General appearance: alert Head: Normocephalic, without obvious abnormality, atraumatic Neck: no adenopathy, no carotid bruit, no JVD, supple, symmetrical, trachea midline and thyroid not enlarged, symmetric, no tenderness/mass/nodules Lymph nodes: Cervical, supraclavicular, and axillary nodes normal. Inguinal lymph node was palpable (pea size) Non-tender. Heart:regular rate and rhythm, S1, S2 normal, no murmur, click, rub or gallop Lung:chest clear, no wheezing, rales, normal symmetric air entry, Heart exam - S1, S2 normal, no murmur, no gallop, rate regular Abdomin: soft, non-tender, without masses or organomegaly EXT:no erythema, induration, or nodules Lymph nodes: no lymph node enlargement noted on exam today.    Lab Results: Lab Results  Component Value Date   WBC 4.2 02/24/2012   HGB 15.2 02/24/2012   HCT 43.5 02/24/2012   MCV 92.8 02/24/2012   PLT 192 02/24/2012     Chemistry      Component Value Date/Time   NA 138 11/22/2011 1130   K 4.3 11/22/2011 1130   CL 103 11/22/2011 1130   CO2 24 11/22/2011 1130   BUN 15 11/22/2011 1130   CREATININE 1.06 11/22/2011 1130      Component Value Date/Time   CALCIUM 9.8 11/22/2011 1130   ALKPHOS 46 11/22/2011 1130   AST 30 11/22/2011 1130   ALT 21 11/22/2011 1130   BILITOT 0.9 11/22/2011 1130       Impression and Plan:  A 26 year old gentleman with the following issues: 1. Stage IIA Hodgkin disease.  He has good risk features, diagnosed in August 2011.  No evidence to  suggest recurrent disease.  His last PET/CT scan was done in January of 2013. These abnormalities noted likely related to infection and now clinically is not evident. I will repeat imaging studies in 06/2012.  2. Methicillin-susceptible Staphylococcus aureus  (MSSA) bacteremia.  That has resolved at this time, no residual effects.    Eric Hose, MD 5/10/20134:19 PM

## 2012-06-27 ENCOUNTER — Other Ambulatory Visit: Payer: BC Managed Care – PPO

## 2012-06-27 ENCOUNTER — Encounter (HOSPITAL_COMMUNITY): Admission: RE | Admit: 2012-06-27 | Payer: BC Managed Care – PPO | Source: Ambulatory Visit

## 2012-06-29 ENCOUNTER — Ambulatory Visit: Payer: BC Managed Care – PPO | Admitting: Oncology

## 2014-12-17 ENCOUNTER — Telehealth: Payer: Self-pay | Admitting: Oncology

## 2014-12-17 NOTE — Telephone Encounter (Signed)
Lft msg for pt confirming MD visit per referral from [facility] given to me by [facility], advised pt to c/b to confirm this D/T was ok or if he needed to r/s..... KJ

## 2014-12-19 ENCOUNTER — Ambulatory Visit (HOSPITAL_BASED_OUTPATIENT_CLINIC_OR_DEPARTMENT_OTHER): Payer: 59 | Admitting: Oncology

## 2014-12-19 VITALS — BP 156/79 | HR 66 | Temp 98.2°F | Resp 20 | Ht 73.0 in | Wt 189.3 lb

## 2014-12-19 DIAGNOSIS — C8190 Hodgkin lymphoma, unspecified, unspecified site: Secondary | ICD-10-CM

## 2014-12-19 NOTE — Progress Notes (Signed)
Hematology and Oncology Follow Up Visit  Eric Watkins [id number] 31-Jan-1986 29 y.o. 12/19/2014 1:45 PM  CC: Melissa Montane, M.D.  [facility]    Principle Diagnosis: This is a 29 year old gentleman diagnosed with stage IIA Hodgkin disease.  He has good risk features diagnosed in August of 2011.   Prior Therapy: The patient treated with 2 cycles of ABVD followed by involved field radiation completed in March of 2012.  He received a total of 25.2 Gy at the cervical, supraclavicular, axillary lymph nodes.   Current therapy: Observation with surveillance.   Interim History:   Mr. Eric Watkins presents today for a follow-up visit. He has not been seen here since May 2013. Since his last visit he has been doing very well. She continues to work full time without any evidence to suggest recurrent disease. On a routine physical examination, he noted to have possible inguinal lymph node and asked to return for a follow-up. He does not report any fatigue, tiredness or shortness of breath. He does not report any headaches, blurry vision, double vision or syncope. He does not report any cough or hemoptysis or hematemesis. He does not report any chest pain, palpitation or leg edema. He does not report any genitourinary complaints or discharge. Has not reported any recent recurrent infections. Has not reported any skeletal complaints. Rest of his review of systems unremarkable.  Medications: I have reviewed the patient's current medications. No current outpatient prescriptions on file.  Allergies: No Known Allergies  Past Medical History, Surgical history, Social history, and Family History were reviewed and updated.   Physical Exam: Blood pressure 156/79, pulse 66, temperature 98.2 F (36.8 C), temperature source Oral, resp. rate 20, height 6\' 1"  (1.854 m), weight 189 lb 4.8 oz (85.866 kg), SpO2 100 %. ECOG: 0 General appearance: alert awake not in any distress. Head: Normocephalic, without  obvious abnormality Neck: no adenopathy Lymph nodes: Cervical, supraclavicular, and axillary nodes normal. I could not appreciate any inguinal adenopathy. Heart:regular rate and rhythm, S1, S2 normal, no murmur, click, rub or gallop Lung:chest clear, no wheezing, rales, normal symmetric air entry. Abdomin: soft, non-tender, without masses or organomegaly EXT:no erythema, induration, or nodules   Lab Results: Lab Results  Component Value Date   WBC 4.2 02/24/2012   HGB 15.2 02/24/2012   HCT 43.5 02/24/2012   MCV 92.8 02/24/2012   PLT 192 02/24/2012     Chemistry      Component Value Date/Time   NA 141 02/24/2012 1541   K 4.0 02/24/2012 1541   CL 104 02/24/2012 1541   CO2 27 02/24/2012 1541   BUN 15 02/24/2012 1541   CREATININE 1.08 02/24/2012 1541      Component Value Date/Time   CALCIUM 9.7 02/24/2012 1541   ALKPHOS 44 02/24/2012 1541   AST 29 02/24/2012 1541   ALT 24 02/24/2012 1541   BILITOT 0.4 02/24/2012 1541       Impression and Plan:  A 29 year old gentleman with the following issues: 1. Stage IIA Hodgkin disease.  He has good risk features, diagnosed in August 2011.  His last PET scan showed some reactive lymphadenopathy and has not followed up since January 2013. Now has a presumably palpable lymph node on the right inguinal canal directed by routine physical examination. It is not very palpable on my today's exam but given his borderline reactive lymphadenopathy back in January 2013 it would be reasonable to repeat his imaging studies. I will set him up with a PET scan in  the near future.  2. Methicillin-susceptible Staphylococcus aureus (MSSA) bacteremia.  That has resolved at this time, no residual effects. 3. Follow-up will be determined by the results of the PET scan.    Zola Button, MD 3/4/20161:45 PM

## 2014-12-26 ENCOUNTER — Ambulatory Visit (HOSPITAL_COMMUNITY)
Admission: RE | Admit: 2014-12-26 | Discharge: 2014-12-26 | Disposition: A | Discharge status: Home / Self Care | Payer: 59 | Source: Ambulatory Visit | Attending: Oncology | Admitting: Oncology

## 2014-12-26 DIAGNOSIS — R590 Localized enlarged lymph nodes: Secondary | ICD-10-CM | POA: Insufficient documentation

## 2014-12-26 DIAGNOSIS — C8190 Hodgkin lymphoma, unspecified, unspecified site: Secondary | ICD-10-CM | POA: Diagnosis present

## 2014-12-26 LAB — GLUCOSE, CAPILLARY: GLUCOSE-CAPILLARY: 79 mg/dL (ref 70–99)

## 2014-12-26 MED ORDER — FLUDEOXYGLUCOSE F - 18 (FDG) INJECTION
9.4000 | Freq: Once | INTRAVENOUS | Status: AC | PRN
  Administered 2014-12-26: 9.4 via INTRAVENOUS

## 2014-12-29 ENCOUNTER — Telehealth: Payer: Self-pay | Admitting: Oncology

## 2014-12-29 ENCOUNTER — Other Ambulatory Visit: Payer: Self-pay | Admitting: Oncology

## 2014-12-29 DIAGNOSIS — C8190 Hodgkin lymphoma, unspecified, unspecified site: Secondary | ICD-10-CM

## 2014-12-29 NOTE — Telephone Encounter (Signed)
Lft msg for pt confirming labs/ov per 03/14 POF, mailed schedule to pt.... Eric Watkins

## 2014-12-29 NOTE — Progress Notes (Signed)
PET/CT scan images were reviewed with radiology. His hypermetabolic lymph node appears to be likely reactive in nature. We reviewed imaging studies from the previous PET/CT scan which showed also positive inguinal lymphadenopathy which since has resolved. My plan is to continue active surveillance and asking for a clinical visit in 6 months to clinically examine him to make sure resolution of foot appears to be reactive inguinal lymph node.

## 2015-01-01 ENCOUNTER — Telehealth: Payer: Self-pay | Admitting: *Deleted

## 2015-01-01 NOTE — Telephone Encounter (Signed)
Lm on patient's answering machine to call dr Hazeline Junker nurse.   We need to let him know his PET scan is GOOD, but dr Alen Blew wants to f/u with him in 6 months. A POF was sent to schedulers.

## 2015-01-01 NOTE — Telephone Encounter (Signed)
-----   Message from Wyatt Portela, MD sent at 12/29/2014  9:03 AM EDT ----- I tried to call him with the results of the PET with no success. PET is good, but I need to see him in 6 months in follow up. Please let him know that. I will send POF to schedulers.

## 2015-01-02 NOTE — Telephone Encounter (Signed)
Eric Watkins called back.  Gave him the information that his PET scan is good and that he will receive a call regarding an appointment to follow up with Dr. Alen Blew in 6 months or so.

## 2015-07-01 ENCOUNTER — Other Ambulatory Visit: Payer: 59

## 2015-07-01 ENCOUNTER — Ambulatory Visit: Payer: 59 | Admitting: Oncology

## 2017-03-10 DIAGNOSIS — Z0000 Encounter for general adult medical examination without abnormal findings: Secondary | ICD-10-CM | POA: Diagnosis not present

## 2017-03-10 DIAGNOSIS — Z1389 Encounter for screening for other disorder: Secondary | ICD-10-CM | POA: Diagnosis not present

## 2020-05-11 ENCOUNTER — Observation Stay (HOSPITAL_COMMUNITY): Payer: 59

## 2020-05-11 ENCOUNTER — Inpatient Hospital Stay (HOSPITAL_COMMUNITY)
Admission: EM | Admit: 2020-05-11 | Discharge: 2020-05-15 | DRG: 177 | Disposition: A | Discharge status: Home / Self Care | Payer: 59 | Attending: Internal Medicine | Admitting: Internal Medicine

## 2020-05-11 ENCOUNTER — Other Ambulatory Visit: Payer: Self-pay

## 2020-05-11 ENCOUNTER — Encounter (HOSPITAL_COMMUNITY): Payer: Self-pay | Admitting: Emergency Medicine

## 2020-05-11 ENCOUNTER — Emergency Department (HOSPITAL_COMMUNITY): Payer: 59

## 2020-05-11 DIAGNOSIS — R7401 Elevation of levels of liver transaminase levels: Secondary | ICD-10-CM | POA: Diagnosis present

## 2020-05-11 DIAGNOSIS — J1282 Pneumonia due to coronavirus disease 2019: Secondary | ICD-10-CM | POA: Diagnosis not present

## 2020-05-11 DIAGNOSIS — Z8572 Personal history of non-Hodgkin lymphomas: Secondary | ICD-10-CM

## 2020-05-11 DIAGNOSIS — U071 COVID-19: Secondary | ICD-10-CM | POA: Diagnosis not present

## 2020-05-11 DIAGNOSIS — R7989 Other specified abnormal findings of blood chemistry: Secondary | ICD-10-CM | POA: Diagnosis not present

## 2020-05-11 DIAGNOSIS — J9601 Acute respiratory failure with hypoxia: Secondary | ICD-10-CM | POA: Diagnosis present

## 2020-05-11 DIAGNOSIS — J9600 Acute respiratory failure, unspecified whether with hypoxia or hypercapnia: Secondary | ICD-10-CM | POA: Diagnosis present

## 2020-05-11 DIAGNOSIS — T391X1A Poisoning by 4-Aminophenol derivatives, accidental (unintentional), initial encounter: Secondary | ICD-10-CM | POA: Diagnosis present

## 2020-05-11 DIAGNOSIS — E876 Hypokalemia: Secondary | ICD-10-CM | POA: Diagnosis present

## 2020-05-11 HISTORY — DX: Non-Hodgkin lymphoma, unspecified, unspecified site: C85.90

## 2020-05-11 LAB — CBC WITH DIFFERENTIAL/PLATELET
Abs Immature Granulocytes: 0.02 10*3/uL (ref 0.00–0.07)
Basophils Absolute: 0 10*3/uL (ref 0.0–0.1)
Basophils Relative: 0 %
Eosinophils Absolute: 0 10*3/uL (ref 0.0–0.5)
Eosinophils Relative: 0 %
HCT: 43.4 % (ref 39.0–52.0)
Hemoglobin: 15.3 g/dL (ref 13.0–17.0)
Immature Granulocytes: 0 %
Lymphocytes Relative: 9 %
Lymphs Abs: 0.7 10*3/uL (ref 0.7–4.0)
MCH: 32.4 pg (ref 26.0–34.0)
MCHC: 35.3 g/dL (ref 30.0–36.0)
MCV: 91.9 fL (ref 80.0–100.0)
Monocytes Absolute: 0.4 10*3/uL (ref 0.1–1.0)
Monocytes Relative: 5 %
Neutro Abs: 6.3 10*3/uL (ref 1.7–7.7)
Neutrophils Relative %: 86 %
Platelets: 202 10*3/uL (ref 150–400)
RBC: 4.72 MIL/uL (ref 4.22–5.81)
RDW: 11.9 % (ref 11.5–15.5)
WBC: 7.4 10*3/uL (ref 4.0–10.5)
nRBC: 0 % (ref 0.0–0.2)

## 2020-05-11 LAB — COMPREHENSIVE METABOLIC PANEL
ALT: 398 U/L — ABNORMAL HIGH (ref 0–44)
AST: 404 U/L — ABNORMAL HIGH (ref 15–41)
Albumin: 3.9 g/dL (ref 3.5–5.0)
Alkaline Phosphatase: 74 U/L (ref 38–126)
Anion gap: 12 (ref 5–15)
BUN: 12 mg/dL (ref 6–20)
CO2: 26 mmol/L (ref 22–32)
Calcium: 8.7 mg/dL — ABNORMAL LOW (ref 8.9–10.3)
Chloride: 101 mmol/L (ref 98–111)
Creatinine, Ser: 0.8 mg/dL (ref 0.61–1.24)
GFR calc Af Amer: >60 mL/min (ref >60)
GFR calc non Af Amer: >60 mL/min (ref >60)
Glucose, Bld: 109 mg/dL — ABNORMAL HIGH (ref 70–99)
Potassium: 3.5 mmol/L (ref 3.5–5.1)
Sodium: 139 mmol/L (ref 135–145)
Total Bilirubin: 1.1 mg/dL (ref 0.3–1.2)
Total Protein: 7.5 g/dL (ref 6.5–8.1)

## 2020-05-11 LAB — HEPATITIS PANEL, ACUTE
HCV Ab: NONREACTIVE
Hep A IgM: NONREACTIVE
Hep B C IgM: NONREACTIVE
Hepatitis B Surface Ag: NONREACTIVE

## 2020-05-11 LAB — PROTIME-INR
INR: 1.2 (ref 0.8–1.2)
Prothrombin Time: 14.5 seconds (ref 11.4–15.2)

## 2020-05-11 LAB — TRIGLYCERIDES: Triglycerides: 121 mg/dL (ref <150)

## 2020-05-11 LAB — ABO/RH: ABO/RH(D): A POS

## 2020-05-11 LAB — POC SARS CORONAVIRUS 2 AG -  ED: SARS Coronavirus 2 Ag: POSITIVE — AB

## 2020-05-11 LAB — PROCALCITONIN: Procalcitonin: 0.76 ng/mL

## 2020-05-11 LAB — ACETAMINOPHEN LEVEL: Acetaminophen (Tylenol), Serum: 33 ug/mL — ABNORMAL HIGH (ref 10–30)

## 2020-05-11 LAB — HIV ANTIBODY (ROUTINE TESTING W REFLEX): HIV Screen 4th Generation wRfx: NONREACTIVE

## 2020-05-11 LAB — FERRITIN: Ferritin: 5553 ng/mL — ABNORMAL HIGH (ref 24–336)

## 2020-05-11 LAB — C-REACTIVE PROTEIN: CRP: 15 mg/dL — ABNORMAL HIGH (ref <1.0)

## 2020-05-11 LAB — D-DIMER, QUANTITATIVE: D-Dimer, Quant: 0.43 ug/mL-FEU (ref 0.00–0.50)

## 2020-05-11 LAB — LACTIC ACID, PLASMA: Lactic Acid, Venous: 1 mmol/L (ref 0.5–1.9)

## 2020-05-11 LAB — FIBRINOGEN: Fibrinogen: 727 mg/dL — ABNORMAL HIGH (ref 210–475)

## 2020-05-11 LAB — LACTATE DEHYDROGENASE: LDH: 582 U/L — ABNORMAL HIGH (ref 98–192)

## 2020-05-11 MED ORDER — HYDROCOD POLST-CPM POLST ER 10-8 MG/5ML PO SUER
5.0000 mL | Freq: Two times a day (BID) | ORAL | Status: DC | PRN
  Filled 2020-05-11: qty 5

## 2020-05-11 MED ORDER — SODIUM CHLORIDE 0.9 % IV SOLN: 100.0000 mg | Freq: Every day | INTRAVENOUS | Status: DC

## 2020-05-11 MED ORDER — SODIUM CHLORIDE 0.9 % IV SOLN: 200.0000 mg | Freq: Once | INTRAVENOUS | Status: DC

## 2020-05-11 MED ORDER — SODIUM CHLORIDE 0.9 % IV SOLN
100.0000 mg | INTRAVENOUS | Status: AC
  Administered 2020-05-11 (×2): 100 mg via INTRAVENOUS
  Filled 2020-05-11 (×2): qty 20

## 2020-05-11 MED ORDER — ALBUTEROL SULFATE HFA 108 (90 BASE) MCG/ACT IN AERS
2.0000 | INHALATION_SPRAY | Freq: Four times a day (QID) | RESPIRATORY_TRACT | Status: DC
  Administered 2020-05-11 – 2020-05-15 (×15): 2 via RESPIRATORY_TRACT
  Filled 2020-05-11 (×2): qty 6.7

## 2020-05-11 MED ORDER — ACETYLCYSTEINE LOAD VIA INFUSION
150.0000 mg/kg | Freq: Once | INTRAVENOUS | Status: AC
  Administered 2020-05-11: 12240 mg via INTRAVENOUS
  Filled 2020-05-11: qty 306

## 2020-05-11 MED ORDER — ONDANSETRON HCL 4 MG PO TABS: 4.0000 mg | ORAL_TABLET | Freq: Four times a day (QID) | ORAL | Status: DC | PRN

## 2020-05-11 MED ORDER — GUAIFENESIN-DM 100-10 MG/5ML PO SYRP: 10.0000 mL | ORAL_SOLUTION | ORAL | Status: DC | PRN

## 2020-05-11 MED ORDER — ENOXAPARIN SODIUM 40 MG/0.4ML ~~LOC~~ SOLN
40.0000 mg | SUBCUTANEOUS | Status: DC
  Administered 2020-05-11 – 2020-05-15 (×5): 40 mg via SUBCUTANEOUS
  Filled 2020-05-11 (×5): qty 0.4

## 2020-05-11 MED ORDER — METHYLPREDNISOLONE SODIUM SUCC 125 MG IJ SOLR
0.5000 mg/kg | Freq: Two times a day (BID) | INTRAMUSCULAR | Status: DC
  Administered 2020-05-11 – 2020-05-15 (×9): 40.625 mg via INTRAVENOUS
  Filled 2020-05-11 (×9): qty 2

## 2020-05-11 MED ORDER — TRAMADOL HCL 50 MG PO TABS: 50.0000 mg | ORAL_TABLET | Freq: Four times a day (QID) | ORAL | Status: DC | PRN

## 2020-05-11 MED ORDER — SODIUM CHLORIDE 0.9 % IV SOLN
100.0000 mg | Freq: Every day | INTRAVENOUS | Status: AC
  Administered 2020-05-12 – 2020-05-15 (×4): 100 mg via INTRAVENOUS
  Filled 2020-05-11 (×2): qty 100
  Filled 2020-05-11 (×3): qty 20

## 2020-05-11 MED ORDER — ZINC SULFATE 220 (50 ZN) MG PO CAPS
220.0000 mg | ORAL_CAPSULE | Freq: Every day | ORAL | Status: DC
  Administered 2020-05-11 – 2020-05-15 (×5): 220 mg via ORAL
  Filled 2020-05-11 (×5): qty 1

## 2020-05-11 MED ORDER — ONDANSETRON HCL 4 MG/2ML IJ SOLN: 4.0000 mg | Freq: Four times a day (QID) | INTRAMUSCULAR | Status: DC | PRN

## 2020-05-11 MED ORDER — DEXTROSE 5 % IV SOLN
15.0000 mg/kg/h | INTRAVENOUS | Status: AC
  Administered 2020-05-11 – 2020-05-12 (×2): 15 mg/kg/h via INTRAVENOUS
  Filled 2020-05-11 (×6): qty 120

## 2020-05-11 MED ORDER — ASCORBIC ACID 500 MG PO TABS
500.0000 mg | ORAL_TABLET | Freq: Every day | ORAL | Status: DC
  Administered 2020-05-11 – 2020-05-15 (×5): 500 mg via ORAL
  Filled 2020-05-11 (×5): qty 1

## 2020-05-11 NOTE — Progress Notes (Signed)
MEDICATION RELATED CONSULT NOTE - INITIAL   Pharmacy Consult for Acetadote Indication: Accidental overdose with Acetaminophen  No Known Allergies  Patient Measurements: Height: 6\' 1"  (185.4 cm) Weight: 81.6 kg (180 lb) IBW/kg (Calculated) : 79.9  Vital Signs: Temp: 98.7 F (37.1 C) (07/26 0908) Temp Source: Oral (07/26 0908) BP: 128/77 (07/26 1530) Pulse Rate: 83 (07/26 1530) Intake/Output from previous day: No intake/output data recorded. Intake/Output from this shift: Total I/O In: 187.8 [IV Piggyback:187.8] Out: -   Labs: Recent Labs    05/11/20 0946  WBC 7.4  HGB 15.3  HCT 43.4  PLT 202  CREATININE 0.80  ALBUMIN 3.9  PROT 7.5  AST 404*  ALT 398*  ALKPHOS 74  BILITOT 1.1   Estimated Creatinine Clearance: 144.3 mL/min (by C-G formula based on SCr of 0.8 mg/dL).   Microbiology: No results found for this or any previous visit (from the past 720 hour(s)).  Medical History: Past Medical History:  Diagnosis Date  . Non Hodgkin's lymphoma ([place])     Medications:  See med rec  Assessment: Patient presented with SOB. His symptoms progressed to include nonproductive cough, generalized weakness and nasal congestion. Acute respiratory with hypoxia.  Secondary to COVID-19 pneumonia.  Patient reports taking large amounts of Tylenol  And has elevated transaminases. Dr. Roderic Palau contacted Poison control and they recommended acetadote with elevated APAP level 18mcg/ml. Exact time of last acetaminophen dose was not recorded.  Plan:  Initiate Acetadote order set: Acetadote 150mg /kg IV LD, then acetylcysteine 15mg /kg/hr.  F/U APAP level, LFTS and Poison control Monitor labs (INR)  Isac Sarna, BS Vena [place], BCPS Clinical Pharmacist Pager [phone] 05/11/2020,4:07 PM

## 2020-05-11 NOTE — ED Notes (Signed)
Pt o2 SATs in low 70s. O2 increased to 4L with no improvement.  Pt breathing is even HR in 80s BP steady. RR 20s-30s. RN notified came in double checked o2 with portable o2 still in low 80s. Pt placed on Non-rebreather @ 14L. Pt now fluctuating 89%- 90%

## 2020-05-11 NOTE — ED Notes (Signed)
Patient started to de-sat around 81 percent. Pulse ox changed and portable pulse-ox confirmed that patient's oxygen saturations were 81 percent after portable pulse ox was used on patient. Non-rebreather at 15 liters placed on patient and sats increased around 90 percent. Respiratory notified and at bedside.

## 2020-05-11 NOTE — ED Notes (Signed)
U/s at bedside

## 2020-05-11 NOTE — ED Provider Notes (Signed)
[facility] Provider Note   CSN: [id number] Arrival date & time: 05/11/20  0825     History Chief Complaint  Patient presents with  . Shortness of Breath    Eric Watkins is a 34 y.o. male.  HPI Patient presents with shortness of breath and cough.  Has had for around 5 days.  Minimal sputum production.  Some chills.  No fevers.  States more fatigue with exertion.  No known Covid contacts but has not had his Covid vaccine.  No swelling in his legs.    Past Medical History:  Diagnosis Date  . Non Hodgkin's lymphoma [facility])     Patient Active Problem List   Diagnosis Date Noted  . SHOULDER PAIN 08/16/2010  . TRANSAMINASES, SERUM, ELEVATED 08/16/2010  . SEPSIS 08/16/2010    Past Surgical History:  Procedure Laterality Date  . TONSILLECTOMY         History reviewed. No pertinent family history.  Social History   Tobacco Use  . Smoking status: Never Smoker  . Smokeless tobacco: Never Used  Vaping Use  . Vaping Use: Never used  Substance Use Topics  . Alcohol use: Never  . Drug use: Never    Home Medications Prior to Admission medications   Not on File    Allergies    Patient has no known allergies.  Review of Systems   Review of Systems  Constitutional: Positive for appetite change and fatigue.  Respiratory: Positive for cough and shortness of breath.   Cardiovascular: Negative for chest pain.  Gastrointestinal: Negative for abdominal pain.  Musculoskeletal: Positive for back pain.  Neurological: Negative for weakness.  Psychiatric/Behavioral: Negative for confusion.    Physical Exam Updated Vital Signs BP 128/83   Pulse 78   Temp 98.7 F (37.1 C) (Oral)   Resp (!) 30   Ht 6\' 1"  (1.854 m)   Wt 81.6 kg   SpO2 92%   BMI 23.75 kg/m   Physical Exam Vitals and nursing note reviewed.  HENT:     Head: Normocephalic.  Cardiovascular:     Rate and Rhythm: Normal rate and regular rhythm.  Pulmonary:     Comments:  tachypnea without respiratory distress. Chest:     Chest wall: No tenderness.  Musculoskeletal:     Right lower leg: No edema.     Left lower leg: No edema.  Skin:    General: Skin is warm.     Capillary Refill: Capillary refill takes less than 2 seconds.  Neurological:     Mental Status: He is alert and oriented to person, place, and time.     ED Results / Procedures / Treatments   Labs (all labs ordered are listed, but only abnormal results are displayed) Labs Reviewed  COMPREHENSIVE METABOLIC PANEL - Abnormal; Notable for the following components:      Result Value   Glucose, Bld 109 (*)    Calcium 8.7 (*)    AST 404 (*)    ALT 398 (*)    All other components within normal limits  POC SARS CORONAVIRUS 2 AG -  ED - Abnormal; Notable for the following components:   SARS Coronavirus 2 Ag POSITIVE (*)    All other components within normal limits  CULTURE, BLOOD (ROUTINE X 2)  CULTURE, BLOOD (ROUTINE X 2)  CBC WITH DIFFERENTIAL/PLATELET  LACTIC ACID, PLASMA  LACTIC ACID, PLASMA  D-DIMER, QUANTITATIVE (NOT AT [facility])  PROCALCITONIN  LACTATE DEHYDROGENASE  FERRITIN  TRIGLYCERIDES  FIBRINOGEN  C-REACTIVE PROTEIN    EKG EKG Interpretation  Date/Time:  Monday May 11 2020 09:23:04 EDT Ventricular Rate:  79 PR Interval:    QRS Duration: 119 QT Interval:  361 QTC Calculation: 414 R Axis:   24 Text Interpretation: Sinus rhythm Incomplete right bundle branch block Confirmed by Davonna Belling [phone]) on 05/11/2020 9:32:01 AM   Radiology DG Chest Portable 1 View  Result Date: 05/11/2020 CLINICAL DATA:  Generalized body aches, lower back pain, painful cough EXAM: PORTABLE CHEST 1 VIEW COMPARISON:  12/11/2013 FINDINGS: The heart size and mediastinal contours are within normal limits. Bibasilar predominant heterogeneous airspace opacity throughout the lungs. The visualized skeletal structures are unremarkable. IMPRESSION: Bibasilar predominant heterogeneous airspace opacity  throughout the lungs, concerning for multifocal infection, including COVID-19 if clinically suspected. Electronically Signed   By: Eddie Candle M.D.   On: 05/11/2020 10:07    Procedures Procedures (including critical care time)  Medications Ordered in ED Medications - No data to display  ED Course  I have reviewed the triage vital signs and the nursing notes.  Pertinent labs & imaging results that were available during my care of the patient were reviewed by me and considered in my medical decision making (see chart for details).    MDM Rules/Calculators/A&P                          Patient presents with shortness of breath.  No Covid vaccine and is Covid positive.  X-ray shows likely Covid pneumonia.  Has oxygen requirement at 2 or 3 L right now.  Sats improved on that oxygen to 85% on room air.  Will require admission the hospital.  Will discuss with hospitalist Final Clinical Impression(s) / ED Diagnoses Final diagnoses:  COVID-19    Rx / DC Orders ED Discharge Orders    None       Davonna Belling, MD 05/11/20 1024

## 2020-05-11 NOTE — Progress Notes (Addendum)
History and Physical    Eric Watkins HLK:[id number] DOB: February 01, 1986 DOA: 05/11/2020  PCP: [facility], [place]  Patient coming from: Home  I have personally briefly reviewed patient's old medical records in [place]  Chief Complaint: Shortness of breath  HPI: Eric Watkins is a 34 y.o. male with medical history significant of previous diagnosis of non-Hodgkin's lymphoma, currently in remission, presents to the hospital with shortness of breath.  Reports onset of symptoms approximately 6 days ago with onset of back pain.  Patient was having intermittent episodes of back pain which was nonradiating.  He denied any falls or heavy lifting.  He has had issues with back pain over the years which has been intermittent.  His symptoms progressed to include nonproductive cough, generalized weakness and nasal congestion.  Denies any loss of smell, but feels that his sense of taste has been altered.  This morning, he began to feel short of breath which is what prompted his ER visit.  The patient has not been vaccinated for COVID-19.  ED Course: On arrival to the emergency room was noted to be hypoxic on room air with an O2 saturation in the 80s.  Supplemental oxygen was applied at 3 L with improvement of oxygen saturations to the mid to low 90s.  Chest x-ray showed bilateral infiltrates.  COVID-19 test positive.  Also noted to have elevated liver enzymes with transaminases in the 100s.  Lactate was normal.  Vitals are otherwise stable.  He has been referred for admission.  Review of Systems:  Review of Systems  Constitutional: Positive for malaise/fatigue. Negative for fever.  HENT: Positive for congestion. Negative for sore throat.   Respiratory: Positive for cough and shortness of breath. Negative for sputum production.   Cardiovascular: Negative for chest pain and palpitations.  Gastrointestinal: Positive for diarrhea. Negative for melena, nausea and vomiting.  Genitourinary:  Negative for dysuria.  Musculoskeletal: Positive for back pain and myalgias. Negative for falls.  Psychiatric/Behavioral: Negative for substance abuse.     Past Medical History:  Diagnosis Date   Non Hodgkin's lymphoma [facility])     Past Surgical History:  Procedure Laterality Date   TONSILLECTOMY      Social History:  reports that he has never smoked. He has never used smokeless tobacco. He reports that he does not drink alcohol and does not use drugs.  No Known Allergies  Family history: Family history reviewed and not pertinent  Prior to Admission medications   Medication Sig Start Date End Date Taking? Authorizing Provider  acetaminophen (TYLENOL) 500 MG tablet Take 500 mg by mouth every 6 (six) hours as needed.   Yes [provider]    Physical Exam: Vitals:   05/11/20 0908 05/11/20 0934 05/11/20 1100 05/11/20 1130  BP: (!) 139/81 128/83 118/80 121/83  Pulse: 94 78 75 75  Resp: 20 (!) 30 (!) 30 (!) 31  Temp: 98.7 F (37.1 C)     TempSrc: Oral     SpO2: (!) 85% 92% 93% 92%  Weight: 81.6 kg     Height: 6\' 1"  (1.854 m)       Constitutional: NAD, calm, comfortable Eyes: PERRL, lids and conjunctivae normal ENMT: Mucous membranes are moist. Posterior pharynx clear of any exudate or lesions.Normal dentition.  Neck: normal, supple, no masses, no thyromegaly Respiratory: clear to auscultation bilaterally, no wheezing, no crackles. Normal respiratory effort. No accessory muscle use.  Cardiovascular: Regular rate and rhythm, no murmurs / rubs / gallops. No extremity  edema. 2+ pedal pulses. No carotid bruits.  Abdomen: no tenderness, no masses palpated. No hepatosplenomegaly. Bowel sounds positive.  Musculoskeletal: no clubbing / cyanosis. No joint deformity upper and lower extremities. Good ROM, no contractures. Normal muscle tone.  Skin: no rashes, lesions, ulcers. No induration Neurologic: CN 2-12 grossly intact. Sensation intact, DTR normal. Strength 5/5 in all  4.  Psychiatric: Normal judgment and insight. Alert and oriented x 3. Normal mood.    Labs on Admission: I have personally reviewed following labs and imaging studies  CBC: Recent Labs  Lab 05/11/20 0946  WBC 7.4  NEUTROABS 6.3  HGB 15.3  HCT 43.4  MCV 91.9  PLT 390   Basic Metabolic Panel: Recent Labs  Lab 05/11/20 0946  NA 139  K 3.5  CL 101  CO2 26  GLUCOSE 109*  BUN 12  CREATININE 0.80  CALCIUM 8.7*   GFR: Estimated Creatinine Clearance: 144.3 mL/min (by C-G formula based on SCr of 0.8 mg/dL). Liver Function Tests: Recent Labs  Lab 05/11/20 0946  AST 404*  ALT 398*  ALKPHOS 74  BILITOT 1.1  PROT 7.5  ALBUMIN 3.9   No results for input(s): LIPASE, AMYLASE in the last 168 hours. No results for input(s): AMMONIA in the last 168 hours. Coagulation Profile: No results for input(s): INR, PROTIME in the last 168 hours. Cardiac Enzymes: No results for input(s): CKTOTAL, CKMB, CKMBINDEX, TROPONINI in the last 168 hours. BNP (last 3 results) No results for input(s): PROBNP in the last 8760 hours. HbA1C: No results for input(s): HGBA1C in the last 72 hours. CBG: No results for input(s): GLUCAP in the last 168 hours. Lipid Profile: Recent Labs    05/11/20 1039  TRIG 121   Thyroid Function Tests: No results for input(s): TSH, T4TOTAL, FREET4, T3FREE, THYROIDAB in the last 72 hours. Anemia Panel: No results for input(s): VITAMINB12, FOLATE, FERRITIN, TIBC, IRON, RETICCTPCT in the last 72 hours. Urine analysis:    Component Value Date/Time   COLORURINE AMBER BIOCHEMICALS MAY BE AFFECTED BY COLOR (A) 07/26/2010 1748   APPEARANCEUR TURBID (A) 07/26/2010 1748   LABSPEC 1.025 07/26/2010 1748   PHURINE 5.5 07/26/2010 1748   GLUCOSEU NEGATIVE 07/26/2010 1748   HGBUR MODERATE (A) 07/26/2010 1748   BILIRUBINUR SMALL (A) 07/26/2010 1748   [place] 07/26/2010 1748   PROTEINUR 100 (A) 07/26/2010 1748   UROBILINOGEN 1.0 07/26/2010 1748   NITRITE  POSITIVE (A) 07/26/2010 1748   LEUKOCYTESUR SMALL (A) 07/26/2010 1748    Radiological Exams on Admission: DG Chest Portable 1 View  Result Date: 05/11/2020 CLINICAL DATA:  Generalized body aches, lower back pain, painful cough EXAM: PORTABLE CHEST 1 VIEW COMPARISON:  12/11/2013 FINDINGS: The heart size and mediastinal contours are within normal limits. Bibasilar predominant heterogeneous airspace opacity throughout the lungs. The visualized skeletal structures are unremarkable. IMPRESSION: Bibasilar predominant heterogeneous airspace opacity throughout the lungs, concerning for multifocal infection, including COVID-19 if clinically suspected. Electronically Signed   By: Eddie Candle M.D.   On: 05/11/2020 10:07    EKG: Independently reviewed.  Sinus rhythm with right bundle branch block  Assessment/Plan Active Problems:   Acute respiratory failure with hypoxia (HCC)   Pneumonia due to COVID-19 virus   Transaminitis   Acute respiratory failure due to COVID-19 ([place])     1. Acute respiratory with hypoxia.  Secondary to COVID-19 pneumonia.  Will start on intravenous remdesivir as well as intravenous steroids.  Continue supportive treatment with bronchodilators, mucolytic's.  Patient has been encouraged to lay  in prone position as much as tolerated. 2. Elevated transaminases.  Patient reports taking large amounts of Tylenol recently for pain.  Will check Tylenol level.  Check right upper quadrant ultrasound and hepatitis panel as well as INR.  Continue to trend. 3. Back pain.  Patient complains of low back pain.  Suspect is related to myalgias from COVID-19.  Blood cultures have been sent.  If symptoms persist, may need further imaging, although currently he is afebrile with normal WBC count making bacterial infection less likely.  DVT prophylaxis: lovenox Code Status: Full code Family Communication: Discussed with patient Disposition Plan: Discharge home once respiratory status is  stabilized Consults called:   Admission status: MedSurg, observation  Kathie Dike MD [facility]   If 7PM-7AM, please contact night-coverage [email or web address]   05/11/2020, 11:45 AM    Addendum:  Tylenol level noted to be elevated at 33.  Case reviewed with poison control with recommendations to start on infusion of N-acetylcysteine.  Repeat labs will be obtained tomorrow afternoon including Tylenol level, LFTs and PT/INR.  [employer]

## 2020-05-11 NOTE — ED Triage Notes (Signed)
PT c/o generalized body aches and lower back pain with painful cough with low grade temp x2 days. PT states he has become more SOB with exertion this am. PT states hx of lymphoma but is in remission since 2014. PT had tylenol at 0800 today prior to ED arrival.

## 2020-05-12 DIAGNOSIS — R7401 Elevation of levels of liver transaminase levels: Secondary | ICD-10-CM

## 2020-05-12 DIAGNOSIS — Z8572 Personal history of non-Hodgkin lymphomas: Secondary | ICD-10-CM | POA: Diagnosis not present

## 2020-05-12 DIAGNOSIS — J9601 Acute respiratory failure with hypoxia: Secondary | ICD-10-CM

## 2020-05-12 DIAGNOSIS — T391X1A Poisoning by 4-Aminophenol derivatives, accidental (unintentional), initial encounter: Secondary | ICD-10-CM

## 2020-05-12 DIAGNOSIS — J1282 Pneumonia due to coronavirus disease 2019: Secondary | ICD-10-CM | POA: Diagnosis present

## 2020-05-12 DIAGNOSIS — E876 Hypokalemia: Secondary | ICD-10-CM | POA: Diagnosis present

## 2020-05-12 DIAGNOSIS — R7989 Other specified abnormal findings of blood chemistry: Secondary | ICD-10-CM | POA: Diagnosis present

## 2020-05-12 DIAGNOSIS — U071 COVID-19: Secondary | ICD-10-CM | POA: Diagnosis present

## 2020-05-12 DIAGNOSIS — J9600 Acute respiratory failure, unspecified whether with hypoxia or hypercapnia: Secondary | ICD-10-CM | POA: Diagnosis not present

## 2020-05-12 LAB — HEPATIC FUNCTION PANEL
ALT: 350 U/L — ABNORMAL HIGH (ref 0–44)
AST: 204 U/L — ABNORMAL HIGH (ref 15–41)
Albumin: 3.4 g/dL — ABNORMAL LOW (ref 3.5–5.0)
Alkaline Phosphatase: 76 U/L (ref 38–126)
Bilirubin, Direct: 0.2 mg/dL (ref 0.0–0.2)
Indirect Bilirubin: 0.7 mg/dL (ref 0.3–0.9)
Total Bilirubin: 0.9 mg/dL (ref 0.3–1.2)
Total Protein: 6.9 g/dL (ref 6.5–8.1)

## 2020-05-12 LAB — COMPREHENSIVE METABOLIC PANEL
ALT: 357 U/L — ABNORMAL HIGH (ref 0–44)
AST: 238 U/L — ABNORMAL HIGH (ref 15–41)
Albumin: 3.5 g/dL (ref 3.5–5.0)
Alkaline Phosphatase: 75 U/L (ref 38–126)
Anion gap: 12 (ref 5–15)
BUN: 12 mg/dL (ref 6–20)
CO2: 26 mmol/L (ref 22–32)
Calcium: 8.9 mg/dL (ref 8.9–10.3)
Chloride: 101 mmol/L (ref 98–111)
Creatinine, Ser: 0.6 mg/dL — ABNORMAL LOW (ref 0.61–1.24)
GFR calc Af Amer: >60 mL/min (ref >60)
GFR calc non Af Amer: >60 mL/min (ref >60)
Glucose, Bld: 171 mg/dL — ABNORMAL HIGH (ref 70–99)
Potassium: 3.9 mmol/L (ref 3.5–5.1)
Sodium: 139 mmol/L (ref 135–145)
Total Bilirubin: 0.9 mg/dL (ref 0.3–1.2)
Total Protein: 7 g/dL (ref 6.5–8.1)

## 2020-05-12 LAB — PROTIME-INR
INR: 1.2 (ref 0.8–1.2)
Prothrombin Time: 14.7 seconds (ref 11.4–15.2)

## 2020-05-12 LAB — VITAMIN D 25 HYDROXY (VIT D DEFICIENCY, FRACTURES): Vit D, 25-Hydroxy: 38.67 ng/mL (ref 30–100)

## 2020-05-12 LAB — D-DIMER, QUANTITATIVE: D-Dimer, Quant: 0.45 ug/mL-FEU (ref 0.00–0.50)

## 2020-05-12 LAB — C-REACTIVE PROTEIN: CRP: 16 mg/dL — ABNORMAL HIGH (ref <1.0)

## 2020-05-12 LAB — FERRITIN: Ferritin: 4230 ng/mL — ABNORMAL HIGH (ref 24–336)

## 2020-05-12 LAB — MRSA PCR SCREENING: MRSA by PCR: NEGATIVE

## 2020-05-12 LAB — ACETAMINOPHEN LEVEL: Acetaminophen (Tylenol), Serum: <10 ug/mL — ABNORMAL LOW (ref 10–30)

## 2020-05-12 MED ORDER — ORAL CARE MOUTH RINSE
15.0000 mL | Freq: Two times a day (BID) | OROMUCOSAL | Status: DC
  Administered 2020-05-12 – 2020-05-14 (×4): 15 mL via OROMUCOSAL

## 2020-05-12 MED ORDER — ENSURE ENLIVE PO LIQD: 237.0000 mL | Freq: Two times a day (BID) | ORAL | Status: DC

## 2020-05-12 MED ORDER — CHLORHEXIDINE GLUCONATE CLOTH 2 % EX PADS
6.0000 | MEDICATED_PAD | Freq: Every day | CUTANEOUS | Status: DC
  Administered 2020-05-12 – 2020-05-15 (×4): 6 via TOPICAL

## 2020-05-12 NOTE — Progress Notes (Signed)
Called poison control per MD request to see if gtt can be stoppped. Advised to draw liver enzymes to see trend. MD made aware. New orders received. Poison control will call for update.

## 2020-05-12 NOTE — Progress Notes (Addendum)
PROGRESS NOTE   Eric Watkins  LNL:[id number] DOB: [birth year] DOA: 05/11/2020 PCP: [facility]   Chief Complaint  Patient presents with  . Shortness of Breath    Brief Admission History:  34 y.o. male with medical history significant of previous diagnosis of non-Hodgkin's lymphoma, currently in remission, presents to the hospital with shortness of breath.  Reports onset of symptoms approximately 6 days ago with onset of back pain.  Patient was having intermittent episodes of back pain which was nonradiating.  He denied any falls or heavy lifting.  He has had issues with back pain over the years which has been intermittent.  His symptoms progressed to include nonproductive cough, generalized weakness and nasal congestion.  Denies any loss of smell, but feels that his sense of taste has been altered.  This morning, he began to feel short of breath which is what prompted his ER visit.  The patient has not been vaccinated for COVID-19.  Assessment & Plan:   Active Problems:   Acute respiratory failure with hypoxia (HCC)   Pneumonia due to COVID-19 virus   Transaminitis   Acute respiratory failure due to COVID-19 [employer])   Acetaminophen overdose, accidental or unintentional, initial encounter   1. Acute respiratory with hypoxia.  Secondary to COVID-19 pneumonia.  His oxygen requirements initially went up but now back down to 6L.  Continue intravenous remdesivir as well as intravenous steroids.  Continue supportive treatment with bronchodilators, mucolytics.  I have consulted pulmonary critical care to assist with management in case he begins to deteriorate.  We will tolerate resting pulse ox goal greater than 84% and goal of >75% when ambulating.  Patient has been encouraged to lay in prone position as much as tolerated. 2. Tylenol Overdose with elevated transaminases.  Patient reports taking large amounts of Tylenol recently for pain.  He has elevated Tylenol level.  Poison  control contacted and recommended starting mucomyst.  Pharmacy consulted for dosing and following.  Pt had normal right upper quadrant ultrasound. 3. Back pain.  Patient complains of low back pain.  Suspect is related to myalgias from COVID-19.  Blood cultures have been sent.  If symptoms persist, may need further imaging, although currently he is afebrile with normal WBC count making bacterial infection less likely.  DVT prophylaxis: lovenox Code Status: Full code Family Communication: Discussed with patient, telephone call to father x2 no answer Disposition Plan: Discharge home once respiratory status is stabilized  Status is: Inpatient  Remains inpatient appropriate because:IV treatments appropriate due to intensity of illness or inability to take PO and Inpatient level of care appropriate due to severity of illness  Dispo: The patient is from: Home              Anticipated d/c is to: Home              Anticipated d/c date is: 3 days              Patient currently is not medically stable to d/c.  Consultants:   pccm  Procedures:     Antimicrobials:  remdesivir  Subjective: Pt denies chest pain, fever, abdominal pain and shortness of breath, continues to have cough.   Objective: Vitals:   05/12/20 1200 05/12/20 1210 05/12/20 1230 05/12/20 1300  BP: (!) 149/93  (!) 136/83 (!) 141/88  Pulse: 86 81 73 81  Resp: (!) 27 23 (!) 29 (!) 27  Temp:      TempSrc:  SpO2:  91% 94% 94%  Weight:      Height:        Intake/Output Summary (Last 24 hours) at 05/12/2020 1311 Last data filed at 05/11/2020 1450 Gross per 24 hour  Intake 187.81 ml  Output --  Net 187.81 ml   Filed Weights   05/11/20 0908  Weight: 81.6 kg    Examination:  General exam: Appears calm and comfortable  Respiratory system: Clear to auscultation. Respiratory effort normal. Cardiovascular system: S1 & S2 heard, RRR. No JVD, murmurs, rubs, gallops or clicks. No pedal edema. Gastrointestinal system:  Abdomen is nondistended, soft and nontender. No organomegaly or masses felt. Normal bowel sounds heard. Central nervous system: Alert and oriented. No focal neurological deficits. Extremities: Symmetric 5 x 5 power. Skin: No rashes, lesions or ulcers Psychiatry: Judgement and insight appear normal. Mood & affect appropriate.   Data Reviewed: I have personally reviewed following labs and imaging studies  CBC: Recent Labs  Lab 05/11/20 0946  WBC 7.4  NEUTROABS 6.3  HGB 15.3  HCT 43.4  MCV 91.9  PLT 409    Basic Metabolic Panel: Recent Labs  Lab 05/11/20 0946 05/12/20 0438  NA 139 139  K 3.5 3.9  CL 101 101  CO2 26 26  GLUCOSE 109* 171*  BUN 12 12  CREATININE 0.80 0.60*  CALCIUM 8.7* 8.9    GFR: Estimated Creatinine Clearance: 144.3 mL/min (A) (by C-G formula based on SCr of 0.6 mg/dL (L)).  Liver Function Tests: Recent Labs  Lab 05/11/20 0946 05/12/20 0438  AST 404* 238*  ALT 398* 357*  ALKPHOS 74 75  BILITOT 1.1 0.9  PROT 7.5 7.0  ALBUMIN 3.9 3.5    CBG: No results for input(s): GLUCAP in the last 168 hours.  No results found for this or any previous visit (from the past 240 hour(s)).   Radiology Studies: DG Chest Portable 1 View  Result Date: 05/11/2020 CLINICAL DATA:  Generalized body aches, lower back pain, painful cough EXAM: PORTABLE CHEST 1 VIEW COMPARISON:  12/11/2013 FINDINGS: The heart size and mediastinal contours are within normal limits. Bibasilar predominant heterogeneous airspace opacity throughout the lungs. The visualized skeletal structures are unremarkable. IMPRESSION: Bibasilar predominant heterogeneous airspace opacity throughout the lungs, concerning for multifocal infection, including COVID-19 if clinically suspected. Electronically Signed   By: Eddie Candle M.D.   On: 05/11/2020 10:07   US Abdomen Limited RUQ  Result Date: 05/11/2020 CLINICAL DATA:  Elevated liver function tests. EXAM: ULTRASOUND ABDOMEN LIMITED RIGHT UPPER QUADRANT  COMPARISON:  Abdominal ultrasound 07/27/2010. FINDINGS: Gallbladder: No gallstones or wall thickening visualized. No sonographic Murphy sign noted by sonographer. Common bile duct: Diameter: 0.3 cm Liver: No focal lesion identified. Within normal limits in parenchymal echogenicity. Portal vein is patent on color Doppler imaging with normal direction of blood flow towards the liver. Other: None. IMPRESSION: Normal examination. Electronically Signed   By: Inge Rise M.D.   On: 05/11/2020 14:18   Scheduled Meds: . albuterol  2 puff Inhalation Q6H  . vitamin C  500 mg Oral Daily  . enoxaparin (LOVENOX) injection  40 mg Subcutaneous Q24H  . methylPREDNISolone (SOLU-MEDROL) injection  0.5 mg/kg Intravenous Q12H  . zinc sulfate  220 mg Oral Daily   Continuous Infusions: . acetylcysteine 15 mg/kg/hr (05/11/20 1820)  . remdesivir 100 mg in NS 100 mL       LOS: 0 days   Critical Care Procedure Note Authorized and Performed by: Murvin Natal MD  Total Critical Care time:  Due to a high probability of clinically significant, life threatening deterioration, the patient required my highest level of preparedness to intervene emergently and I personally spent this critical care time directly and personally managing the patient.  This critical care time included obtaining a history; examining the patient, pulse oximetry; ordering and review of studies; arranging urgent treatment with development of a management plan; evaluation of patient's response of treatment; frequent reassessment; and discussions with other providers.  This critical care time was performed to assess and manage the high probability of imminent and life threatening deterioration that could result in multi-organ failure.  It was exclusive of separately billable procedures and treating other patients and teaching time.   Irwin Brakeman, MD How to contact the [facility] Attending or Consulting provider [place] or covering provider during after  hours [place], for this patient?  1. Check the care team in [facility] and look for a) attending/consulting TR[facility] provider listed and b) the [facility] team listed 2. Log into [email or web address] and use [facility]'s universal password to access. If you do not have the password, please contact the hospital operator. 3. Locate the [facility] provider you are looking for under [facility] and page to a number that you can be directly reached. 4. If you still have difficulty reaching the provider, please page the [facility] (Director on Call) for the Hospitalists listed on amion for assistance.  05/12/2020, 1:11 PM

## 2020-05-12 NOTE — ED Notes (Signed)
Pt placed on high flow O2 at 6L

## 2020-05-12 NOTE — ED Notes (Signed)
Dr. Wynetta Emery in with pt

## 2020-05-13 DIAGNOSIS — T391X1A Poisoning by 4-Aminophenol derivatives, accidental (unintentional), initial encounter: Secondary | ICD-10-CM | POA: Diagnosis not present

## 2020-05-13 DIAGNOSIS — J9600 Acute respiratory failure, unspecified whether with hypoxia or hypercapnia: Secondary | ICD-10-CM | POA: Diagnosis not present

## 2020-05-13 DIAGNOSIS — U071 COVID-19: Secondary | ICD-10-CM | POA: Diagnosis not present

## 2020-05-13 LAB — COMPREHENSIVE METABOLIC PANEL
ALT: 343 U/L — ABNORMAL HIGH (ref 0–44)
AST: 158 U/L — ABNORMAL HIGH (ref 15–41)
Albumin: 3.3 g/dL — ABNORMAL LOW (ref 3.5–5.0)
Alkaline Phosphatase: 76 U/L (ref 38–126)
Anion gap: 12 (ref 5–15)
BUN: 16 mg/dL (ref 6–20)
CO2: 26 mmol/L (ref 22–32)
Calcium: 8.9 mg/dL (ref 8.9–10.3)
Chloride: 102 mmol/L (ref 98–111)
Creatinine, Ser: 0.59 mg/dL — ABNORMAL LOW (ref 0.61–1.24)
GFR calc Af Amer: >60 mL/min (ref >60)
GFR calc non Af Amer: >60 mL/min (ref >60)
Glucose, Bld: 155 mg/dL — ABNORMAL HIGH (ref 70–99)
Potassium: 3.4 mmol/L — ABNORMAL LOW (ref 3.5–5.1)
Sodium: 140 mmol/L (ref 135–145)
Total Bilirubin: 0.9 mg/dL (ref 0.3–1.2)
Total Protein: 7 g/dL (ref 6.5–8.1)

## 2020-05-13 LAB — D-DIMER, QUANTITATIVE: D-Dimer, Quant: 0.36 ug/mL-FEU (ref 0.00–0.50)

## 2020-05-13 LAB — PROTIME-INR
INR: 1.3 — ABNORMAL HIGH (ref 0.8–1.2)
Prothrombin Time: 15.7 seconds — ABNORMAL HIGH (ref 11.4–15.2)

## 2020-05-13 LAB — C-REACTIVE PROTEIN: CRP: 5.5 mg/dL — ABNORMAL HIGH (ref <1.0)

## 2020-05-13 LAB — FERRITIN: Ferritin: 3342 ng/mL — ABNORMAL HIGH (ref 24–336)

## 2020-05-13 MED ORDER — ADULT MULTIVITAMIN W/MINERALS CH
1.0000 | ORAL_TABLET | Freq: Every day | ORAL | Status: DC
  Administered 2020-05-13 – 2020-05-15 (×3): 1 via ORAL
  Filled 2020-05-13 (×3): qty 1

## 2020-05-13 MED ORDER — PROSOURCE PLUS PO LIQD
30.0000 mL | Freq: Two times a day (BID) | ORAL | Status: DC
  Administered 2020-05-14: 30 mL via ORAL
  Filled 2020-05-13 (×4): qty 30

## 2020-05-13 MED ORDER — POTASSIUM CHLORIDE CRYS ER 20 MEQ PO TBCR
40.0000 meq | EXTENDED_RELEASE_TABLET | Freq: Once | ORAL | Status: AC
  Administered 2020-05-13: 40 meq via ORAL
  Filled 2020-05-13: qty 2

## 2020-05-13 NOTE — Progress Notes (Signed)
Initial Nutrition Assessment  DOCUMENTATION CODES:   Not applicable  INTERVENTION:  Ensure Enlive po BID, each supplement provides 350 kcal and 20 grams of protein ProSource Plus 30 ml po BID, each supplement provides 100 kcal and 15 grams of protein MVI with minerals daily Magic cup TID with meals, each supplement provides 290 kcal and 9 grams of protein  NUTRITION DIAGNOSIS:   Increased nutrient needs related to catabolic illness (pneumonia due to COVID-19 virus infection) as evidenced by estimated needs.   GOAL:   Patient will meet greater than or equal to 90% of their needs  MONITOR:   PO intake, Weight trends, Labs, I & O's, Supplement acceptance  REASON FOR ASSESSMENT:   Malnutrition Screening Tool    ASSESSMENT:   34 year old male with past medical history significant of non-Hodgkin's lymphoma, currently in remission presented with shortness of breath, nonproductive cough, generalized weakness, nasal congestion, altered taste, intermittent episodes of nonradiating back pain, and reports taking large amounts of Tylenol recently for pain. Patient admitted for pneumonia due to COVID-19 virus and elevated transaminases secondary to Tylenol overdose.  He is on a regular diet, no documented meals at this time. Patient is receiving Ensure supplement BID per medication review. Will continue supplement as well as order ProSource Plus BID, and provide Magic Cup on meal trays to aid with meeting needs.   Current wt 177.98 lb Last weight prior to admission, 85.9 kg (188.98 lb) on 12/19/2014  I/Os: +998 ml since admit     +810 ml x 24 hrs UOP: 300 ml x 24 hrs Medications reviewed and include: Vit C, Methylprednisolone, Zinc sulfate Acetylcysteine in D5 @ 30.6 mg/hr Remdesivir Labs: K 3.4 (L)   NUTRITION - FOCUSED PHYSICAL EXAM: Unable to complete at this time, RD working remotely due to Covid-19 virus infection.    Diet Order:   Diet Order            Diet regular Room  service appropriate? Yes; Fluid consistency: Thin  Diet effective now                 EDUCATION NEEDS:   No education needs have been identified at this time  Skin:  Skin Assessment: Reviewed RN Assessment  Last BM:  7/28  Height:   Ht Readings from Last 1 Encounters:  05/12/20 6\' 1"  (1.854 m)    Weight:   Wt Readings from Last 1 Encounters:  05/12/20 80.9 kg    Ideal Body Weight:  83.6 kg  BMI:  Body mass index is 23.53 kg/m.  Estimated Nutritional Needs:   Kcal:  2400-2600  Protein:  125-138  Fluid:  >/= 2.4 L   Lajuan Lines, RD, LDN Clinical Nutrition After Hours/Weekend Pager # in [place]

## 2020-05-13 NOTE — Progress Notes (Signed)
Called poison control per RN and Pharmacist due to liver and tylenol lab values coming down continue running current bag of Acetadote but stop gtt when this bag hanging is finished.

## 2020-05-13 NOTE — Progress Notes (Signed)
PROGRESS NOTE    Eric Watkins  NWG:[id number] DOB: 11/23/1985 DOA: 05/11/2020 PCP: [facility]   Brief Narrative:  34 y.o.malewith medical history significant ofprevious diagnosis of non-Hodgkin's lymphoma, currently in remission, presents to the hospital with shortness of breath. Reports onset of symptoms approximately 6 days ago with onset of back pain. Patient was having intermittent episodes of back pain which was nonradiating. He denied any falls or heavy lifting. He has had issues with back pain over the years which has been intermittent. His symptoms progressed to include nonproductive cough, generalized weakness and nasal congestion. Denies any loss of smell, but feels that his sense of taste has been altered. This morning, he began to feel short of breath which is what prompted his ER visit. The patient has not been vaccinated for COVID-19.   Assessment & Plan:   Active Problems:   Acute respiratory failure with hypoxia (HCC)   Pneumonia due to COVID-19 virus   Transaminitis   Acute respiratory failure due to COVID-19 [facility])   Acetaminophen overdose, accidental or unintentional, initial encounter  1. Acute respiratory with hypoxia. Secondary to COVID-19 pneumonia.  His oxygen requirements have gone up overnight to 10 L.  Appreciate PCCM consultation. Continue intravenous remdesivir as well as intravenous steroids. Continue supportive treatment with bronchodilators, mucolytics. Patient has been encouraged to lay in prone position as much as tolerated. 2. Tylenol Overdose with elevated transaminases. Patient reports taking large amounts of Tylenol recently for pain. He has elevated Tylenol level. Poison control contacted and recommended starting mucomyst.  Pharmacy consulted for dosing and following.  Pt had normal right upper quadrant ultrasound. 3. Back pain-improved. Suspect is related to myalgias from COVID-19. Blood cultures have been sent  with no growth in the last 2 days. If symptoms persist, may need further imaging, although currently he is afebrile with normal WBC count making bacterial infection less likely. 4. Mild hypokalemia-replete and reevaluate in a.m.  DVT prophylaxis:lovenox Code Status:Full code Family Communication:Discussed with patient, telephone call to father x2 no answer Disposition Plan:Discharge home once respiratory status is stabilized  Status is: Inpatient  Remains inpatient appropriate because:IV treatments appropriate due to intensity of illness or inability to take PO and Inpatient level of care appropriate due to severity of illness  Dispo: The patient is from: Home  Anticipated d/c is to: Home  Anticipated d/c date is: 2-3 days  Patient currently is not medically stable to d/c.  He is still having high oxygen requirements.  Consultants:   pccm  Procedures:   See below  Antimicrobials:  remdesivir  Subjective: Patient seen and evaluated today with no new acute complaints or concerns. No acute concerns or events noted overnight.  He states that he is feeling "10 times better."  He is not coughing very much nor is he short of breath.  Objective: Vitals:   05/13/20 0927 05/13/20 0944 05/13/20 1000 05/13/20 1100  BP: (!) 155/84  (!) 145/67 (!) 146/72  Pulse: 82 78 71 72  Resp: 19 (!) 30 (!) 29 (!) 25  Temp:      TempSrc:      SpO2: (!) 89% 94% 95% 95%  Weight:      Height:        Intake/Output Summary (Last 24 hours) at 05/13/2020 1154 Last data filed at 05/13/2020 1107 Gross per 24 hour  Intake 1434.36 ml  Output 300 ml  Net 1134.36 ml   Filed Weights   05/11/20 0908 05/12/20 1524  Weight: 81.6  kg 80.9 kg    Examination:  General exam: Appears calm and comfortable  Respiratory system: Clear to auscultation. Respiratory effort normal.  Currently on 10 L nasal cannula oxygen. Cardiovascular system: S1 & S2 heard, RRR. No  JVD, murmurs, rubs, gallops or clicks. No pedal edema. Gastrointestinal system: Abdomen is nondistended, soft and nontender. No organomegaly or masses felt. Normal bowel sounds heard. Central nervous system: Alert and oriented. No focal neurological deficits. Extremities: Symmetric 5 x 5 power. Skin: No rashes, lesions or ulcers Psychiatry: Judgement and insight appear normal. Mood & affect appropriate.     Data Reviewed: I have personally reviewed following labs and imaging studies  CBC: Recent Labs  Lab 05/11/20 0946  WBC 7.4  NEUTROABS 6.3  HGB 15.3  HCT 43.4  MCV 91.9  PLT 696   Basic Metabolic Panel: Recent Labs  Lab 05/11/20 0946 05/12/20 0438 05/13/20 0503  NA 139 139 140  K 3.5 3.9 3.4*  CL 101 101 102  CO2 26 26 26   GLUCOSE 109* 171* 155*  BUN 12 12 16   CREATININE 0.80 0.60* 0.59*  CALCIUM 8.7* 8.9 8.9   GFR: Estimated Creatinine Clearance: 144.3 mL/min (A) (by C-G formula based on SCr of 0.59 mg/dL (L)). Liver Function Tests: Recent Labs  Lab 05/11/20 0946 05/12/20 0438 05/12/20 1729 05/13/20 0503  AST 404* 238* 204* 158*  ALT 398* 357* 350* 343*  ALKPHOS 74 75 76 76  BILITOT 1.1 0.9 0.9 0.9  PROT 7.5 7.0 6.9 7.0  ALBUMIN 3.9 3.5 3.4* 3.3*   No results for input(s): LIPASE, AMYLASE in the last 168 hours. No results for input(s): AMMONIA in the last 168 hours. Coagulation Profile: Recent Labs  Lab 05/11/20 1046 05/12/20 1448 05/13/20 0503  INR 1.2 1.2 1.3*   Cardiac Enzymes: No results for input(s): CKTOTAL, CKMB, CKMBINDEX, TROPONINI in the last 168 hours. BNP (last 3 results) No results for input(s): PROBNP in the last 8760 hours. HbA1C: No results for input(s): HGBA1C in the last 72 hours. CBG: No results for input(s): GLUCAP in the last 168 hours. Lipid Profile: Recent Labs    05/11/20 1039  TRIG 121   Thyroid Function Tests: No results for input(s): TSH, T4TOTAL, FREET4, T3FREE, THYROIDAB in the last 72 hours. Anemia  Panel: Recent Labs    05/12/20 0438 05/13/20 0503  FERRITIN 4,230* 3,342*   Sepsis Labs: Recent Labs  Lab 05/11/20 1031 05/11/20 1039  PROCALCITON  --  0.76  LATICACIDVEN 1.0  --     Recent Results (from the past 240 hour(s))  Blood Culture (routine x 2)     Status: None (Preliminary result)   Collection Time: 05/11/20 10:39 AM   Specimen: BLOOD  Result Value Ref Range Status   Specimen Description BLOOD LEFT ANTECUBITAL  Final   Special Requests   Final    BOTTLES DRAWN AEROBIC AND ANAEROBIC Blood Culture adequate volume   Culture   Final    NO GROWTH 2 DAYS Performed at [facility], [address], [place], [place] [address]    Report Status PENDING  Incomplete  Blood Culture (routine x 2)     Status: None (Preliminary result)   Collection Time: 05/11/20 10:46 AM   Specimen: BLOOD  Result Value Ref Range Status   Specimen Description BLOOD RIGHT ANTECUBITAL  Final   Special Requests   Final    BOTTLES DRAWN AEROBIC AND ANAEROBIC Blood Culture adequate volume   Culture   Final    NO GROWTH 2  DAYS Performed at [facility], [address], [place], [place] [address]    Report Status PENDING  Incomplete  MRSA PCR Screening     Status: None   Collection Time: 05/12/20  3:16 PM   Specimen: Nasal Mucosa; Nasopharyngeal  Result Value Ref Range Status   MRSA by PCR NEGATIVE NEGATIVE Final    Comment:        The GeneXpert MRSA Assay (FDA approved for NASAL specimens only), is one component of a comprehensive MRSA colonization surveillance program. It is not intended to diagnose MRSA infection nor to guide or monitor treatment for MRSA infections. Performed at [facility], [address], [place], [place] [address]          Radiology Studies: US Abdomen Limited RUQ  Result Date: 05/11/2020 CLINICAL DATA:  Elevated liver function tests. EXAM: ULTRASOUND ABDOMEN LIMITED RIGHT UPPER QUADRANT COMPARISON:  Abdominal ultrasound 07/27/2010. FINDINGS: Gallbladder: No  gallstones or wall thickening visualized. No sonographic Murphy sign noted by sonographer. Common bile duct: Diameter: 0.3 cm Liver: No focal lesion identified. Within normal limits in parenchymal echogenicity. Portal vein is patent on color Doppler imaging with normal direction of blood flow towards the liver. Other: None. IMPRESSION: Normal examination. Electronically Signed   By: Inge Rise M.D.   On: 05/11/2020 14:18        Scheduled Meds: . albuterol  2 puff Inhalation Q6H  . vitamin C  500 mg Oral Daily  . Chlorhexidine Gluconate Cloth  6 each Topical Daily  . enoxaparin (LOVENOX) injection  40 mg Subcutaneous Q24H  . feeding supplement (ENSURE ENLIVE)  237 mL Oral BID BM  . mouth rinse  15 mL Mouth Rinse BID  . methylPREDNISolone (SOLU-MEDROL) injection  0.5 mg/kg Intravenous Q12H  . zinc sulfate  220 mg Oral Daily   Continuous Infusions: . acetylcysteine Stopped (05/13/20 1107)  . remdesivir 100 mg in NS 100 mL 100 mg (05/13/20 0923)     LOS: 1 day    Time spent: 35 minutes    Erlinda Solinger D Manuella Ghazi, DO [facility]  If 7PM-7AM, please contact night-coverage [email or web address] 05/13/2020, 11:54 AM

## 2020-05-14 DIAGNOSIS — J9600 Acute respiratory failure, unspecified whether with hypoxia or hypercapnia: Secondary | ICD-10-CM | POA: Diagnosis not present

## 2020-05-14 DIAGNOSIS — T391X1A Poisoning by 4-Aminophenol derivatives, accidental (unintentional), initial encounter: Secondary | ICD-10-CM | POA: Diagnosis not present

## 2020-05-14 DIAGNOSIS — U071 COVID-19: Secondary | ICD-10-CM | POA: Diagnosis not present

## 2020-05-14 LAB — COMPREHENSIVE METABOLIC PANEL
ALT: 269 U/L — ABNORMAL HIGH (ref 0–44)
AST: 75 U/L — ABNORMAL HIGH (ref 15–41)
Albumin: 3.1 g/dL — ABNORMAL LOW (ref 3.5–5.0)
Alkaline Phosphatase: 65 U/L (ref 38–126)
Anion gap: 9 (ref 5–15)
BUN: 19 mg/dL (ref 6–20)
CO2: 27 mmol/L (ref 22–32)
Calcium: 8.9 mg/dL (ref 8.9–10.3)
Chloride: 105 mmol/L (ref 98–111)
Creatinine, Ser: 0.61 mg/dL (ref 0.61–1.24)
GFR calc Af Amer: >60 mL/min (ref >60)
GFR calc non Af Amer: >60 mL/min (ref >60)
Glucose, Bld: 131 mg/dL — ABNORMAL HIGH (ref 70–99)
Potassium: 4.1 mmol/L (ref 3.5–5.1)
Sodium: 141 mmol/L (ref 135–145)
Total Bilirubin: 0.7 mg/dL (ref 0.3–1.2)
Total Protein: 6.1 g/dL — ABNORMAL LOW (ref 6.5–8.1)

## 2020-05-14 LAB — C-REACTIVE PROTEIN: CRP: 1.9 mg/dL — ABNORMAL HIGH (ref <1.0)

## 2020-05-14 LAB — MAGNESIUM: Magnesium: 2.2 mg/dL (ref 1.7–2.4)

## 2020-05-14 LAB — FERRITIN: Ferritin: 1746 ng/mL — ABNORMAL HIGH (ref 24–336)

## 2020-05-14 LAB — D-DIMER, QUANTITATIVE: D-Dimer, Quant: 0.27 ug/mL-FEU (ref 0.00–0.50)

## 2020-05-14 NOTE — Progress Notes (Signed)
PROGRESS NOTE    Eric Watkins  BTD:[id number] DOB: [birth year] DOA: 05/11/2020 PCP: [facility]   Brief Narrative:  34 y.o.malewith medical history significant ofprevious diagnosis of non-Hodgkin's lymphoma, currently in remission, presents to the hospital with shortness of breath. Reports onset of symptoms approximately 6 days ago with onset of back pain. Patient was having intermittent episodes of back pain which was nonradiating. He denied any falls or heavy lifting. He has had issues with back pain over the years which has been intermittent. His symptoms progressed to include nonproductive cough, generalized weakness and nasal congestion. Denies any loss of smell, but feels that his sense of taste has been altered. This morning, he began to feel short of breath which is what prompted his ER visit. The patient has not been vaccinated for COVID-19.  Assessment & Plan:   Active Problems:   Acute respiratory failure with hypoxia (HCC)   Pneumonia due to COVID-19 virus   Transaminitis   Acute respiratory failure due to COVID-19 [facility])   Acetaminophen overdose, accidental or unintentional, initial encounter  1. Acute respiratory with hypoxia. Secondary to COVID-19 pneumonia. His oxygen requirements have decreased to 6 L nasal cannula this morning.  Continue to wean as appropriate. Continue intravenous remdesivir as well as intravenous steroids. Continue supportive treatment with bronchodilators, mucolytics.Patient has been encouraged to lay in prone position as much as tolerated. 2. Tylenol Overdose with elevated transaminases. Patient reports taking large amounts of Tylenol recently for pain.He has elevatedTylenol level. Poison control contacted and recommended starting mucomyst. Pharmacy consulted for dosing and following. Pt had normalright upper quadrant ultrasound.  Mucomyst to be discontinued today.  LFTs downtrending.  Continue to  monitor. 3. Back pain-improved. Suspect is related to myalgias from COVID-19. Blood cultures have been sent with no growth in the last 2 days. If symptoms persist, may need further imaging, although currently he is afebrile with normal WBC count making bacterial infection less likely. 4. Mild hypokalemia-resolved.  Reevaluate in a.m.  DVT prophylaxis:lovenox Code Status:Full code Family Communication:Discussed with patient, no family at bedside Disposition Plan:Discharge home once respiratory status is stabilized  Status is: Inpatient  Remains inpatient appropriate because:IV treatments appropriate due to intensity of illness or inability to take PO and Inpatient level of care appropriate due to severity of illness  Dispo: The patient is from:Home Anticipated d/c is XM:IWOE Anticipated d/c date is: 1-2 days Patient currently is not medically stable to d/c.  He is still having high oxygen requirements.  Consultants:  pccm  Procedures:  See below  Antimicrobials: remdesivir   Subjective: Patient seen and evaluated today with no new acute complaints or concerns. No acute concerns or events noted overnight.  He states that he is overall feeling better each day.  Oxygen requirements are decreasing.  Objective: Vitals:   05/14/20 0900 05/14/20 0946 05/14/20 1000 05/14/20 1100  BP: (!) 140/70  (!) 136/70 (!) 141/75  Pulse: 64  69 64  Resp: 20  23 20   Temp: (!) 96 F (35.6 C)     TempSrc: Oral     SpO2: 94% 97% 94% 95%  Weight:      Height:        Intake/Output Summary (Last 24 hours) at 05/14/2020 1158 Last data filed at 05/14/2020 0800 Gross per 24 hour  Intake 240 ml  Output 775 ml  Net -535 ml   Filed Weights   05/11/20 0908 05/12/20 1524  Weight: 81.6 kg 80.9 kg    Examination:  General exam: Appears calm and comfortable  Respiratory system: Clear to auscultation. Respiratory effort normal.  Currently  on 6 L nasal cannula oxygen. Cardiovascular system: S1 & S2 heard, RRR. No JVD, murmurs, rubs, gallops or clicks. No pedal edema. Gastrointestinal system: Abdomen is nondistended, soft and nontender. No organomegaly or masses felt. Normal bowel sounds heard. Central nervous system: Alert and oriented. No focal neurological deficits. Extremities: Symmetric 5 x 5 power. Skin: No rashes, lesions or ulcers Psychiatry: Judgement and insight appear normal. Mood & affect appropriate.     Data Reviewed: I have personally reviewed following labs and imaging studies  CBC: Recent Labs  Lab 05/11/20 0946  WBC 7.4  NEUTROABS 6.3  HGB 15.3  HCT 43.4  MCV 91.9  PLT 254   Basic Metabolic Panel: Recent Labs  Lab 05/11/20 0946 05/12/20 0438 05/13/20 0503 05/14/20 0515  NA 139 139 140 141  K 3.5 3.9 3.4* 4.1  CL 101 101 102 105  CO2 26 26 26 27   GLUCOSE 109* 171* 155* 131*  BUN 12 12 16 19   CREATININE 0.80 0.60* 0.59* 0.61  CALCIUM 8.7* 8.9 8.9 8.9  MG  --   --   --  2.2   GFR: Estimated Creatinine Clearance: 144.3 mL/min (by C-G formula based on SCr of 0.61 mg/dL). Liver Function Tests: Recent Labs  Lab 05/11/20 0946 05/12/20 0438 05/12/20 1729 05/13/20 0503 05/14/20 0515  AST 404* 238* 204* 158* 75*  ALT 398* 357* 350* 343* 269*  ALKPHOS 74 75 76 76 65  BILITOT 1.1 0.9 0.9 0.9 0.7  PROT 7.5 7.0 6.9 7.0 6.1*  ALBUMIN 3.9 3.5 3.4* 3.3* 3.1*   No results for input(s): LIPASE, AMYLASE in the last 168 hours. No results for input(s): AMMONIA in the last 168 hours. Coagulation Profile: Recent Labs  Lab 05/11/20 1046 05/12/20 1448 05/13/20 0503  INR 1.2 1.2 1.3*   Cardiac Enzymes: No results for input(s): CKTOTAL, CKMB, CKMBINDEX, TROPONINI in the last 168 hours. BNP (last 3 results) No results for input(s): PROBNP in the last 8760 hours. HbA1C: No results for input(s): HGBA1C in the last 72 hours. CBG: No results for input(s): GLUCAP in the last 168 hours. Lipid  Profile: No results for input(s): CHOL, HDL, LDLCALC, TRIG, CHOLHDL, LDLDIRECT in the last 72 hours. Thyroid Function Tests: No results for input(s): TSH, T4TOTAL, FREET4, T3FREE, THYROIDAB in the last 72 hours. Anemia Panel: Recent Labs    05/13/20 0503 05/14/20 0515  FERRITIN 3,342* 1,746*   Sepsis Labs: Recent Labs  Lab 05/11/20 1031 05/11/20 1039  PROCALCITON  --  0.76  LATICACIDVEN 1.0  --     Recent Results (from the past 240 hour(s))  Blood Culture (routine x 2)     Status: None (Preliminary result)   Collection Time: 05/11/20 10:39 AM   Specimen: BLOOD  Result Value Ref Range Status   Specimen Description BLOOD LEFT ANTECUBITAL  Final   Special Requests   Final    BOTTLES DRAWN AEROBIC AND ANAEROBIC Blood Culture adequate volume   Culture   Final    NO GROWTH 3 DAYS Performed at [facility], [address] [place], [place] [address]    Report Status PENDING  Incomplete  Blood Culture (routine x 2)     Status: None (Preliminary result)   Collection Time: 05/11/20 10:46 AM   Specimen: BLOOD  Result Value Ref Range Status   Specimen Description BLOOD RIGHT ANTECUBITAL  Final   Special Requests   Final  BOTTLES DRAWN AEROBIC AND ANAEROBIC Blood Culture adequate volume   Culture   Final    NO GROWTH 3 DAYS Performed at [facility], [address], [place], [place] [address]    Report Status PENDING  Incomplete  MRSA PCR Screening     Status: None   Collection Time: 05/12/20  3:16 PM   Specimen: Nasal Mucosa; Nasopharyngeal  Result Value Ref Range Status   MRSA by PCR NEGATIVE NEGATIVE Final    Comment:        The GeneXpert MRSA Assay (FDA approved for NASAL specimens only), is one component of a comprehensive MRSA colonization surveillance program. It is not intended to diagnose MRSA infection nor to guide or monitor treatment for MRSA infections. Performed at [facility], [address] [place], [place] [address]          Radiology  Studies: No results found.      Scheduled Meds: . (feeding supplement) PROSource Plus  30 mL Oral BID BM  . albuterol  2 puff Inhalation Q6H  . vitamin C  500 mg Oral Daily  . Chlorhexidine Gluconate Cloth  6 each Topical Daily  . enoxaparin (LOVENOX) injection  40 mg Subcutaneous Q24H  . feeding supplement (ENSURE ENLIVE)  237 mL Oral BID BM  . mouth rinse  15 mL Mouth Rinse BID  . methylPREDNISolone (SOLU-MEDROL) injection  0.5 mg/kg Intravenous Q12H  . multivitamin with minerals  1 tablet Oral Daily  . zinc sulfate  220 mg Oral Daily   Continuous Infusions: . remdesivir 100 mg in NS 100 mL 100 mg (05/14/20 0921)     LOS: 2 days    Time spent: 35 minutes    Rendy Lazard Darleen Crocker, DO [facility]  If 7PM-7AM, please contact night-coverage [email or web address] 05/14/2020, 11:58 AM

## 2020-05-15 DIAGNOSIS — J9600 Acute respiratory failure, unspecified whether with hypoxia or hypercapnia: Secondary | ICD-10-CM | POA: Diagnosis not present

## 2020-05-15 DIAGNOSIS — T391X1A Poisoning by 4-Aminophenol derivatives, accidental (unintentional), initial encounter: Secondary | ICD-10-CM | POA: Diagnosis not present

## 2020-05-15 DIAGNOSIS — U071 COVID-19: Secondary | ICD-10-CM | POA: Diagnosis not present

## 2020-05-15 LAB — CBC
HCT: 40.7 % (ref 39.0–52.0)
Hemoglobin: 13.9 g/dL (ref 13.0–17.0)
MCH: 32.3 pg (ref 26.0–34.0)
MCHC: 34.2 g/dL (ref 30.0–36.0)
MCV: 94.7 fL (ref 80.0–100.0)
Platelets: 324 10*3/uL (ref 150–400)
RBC: 4.3 MIL/uL (ref 4.22–5.81)
RDW: 11.8 % (ref 11.5–15.5)
WBC: 7.2 10*3/uL (ref 4.0–10.5)
nRBC: 0 % (ref 0.0–0.2)

## 2020-05-15 LAB — COMPREHENSIVE METABOLIC PANEL
ALT: 213 U/L — ABNORMAL HIGH (ref 0–44)
AST: 43 U/L — ABNORMAL HIGH (ref 15–41)
Albumin: 3.3 g/dL — ABNORMAL LOW (ref 3.5–5.0)
Alkaline Phosphatase: 67 U/L (ref 38–126)
Anion gap: 9 (ref 5–15)
BUN: 17 mg/dL (ref 6–20)
CO2: 25 mmol/L (ref 22–32)
Calcium: 8.7 mg/dL — ABNORMAL LOW (ref 8.9–10.3)
Chloride: 105 mmol/L (ref 98–111)
Creatinine, Ser: 0.6 mg/dL — ABNORMAL LOW (ref 0.61–1.24)
GFR calc Af Amer: >60 mL/min (ref >60)
GFR calc non Af Amer: >60 mL/min (ref >60)
Glucose, Bld: 120 mg/dL — ABNORMAL HIGH (ref 70–99)
Potassium: 4.3 mmol/L (ref 3.5–5.1)
Sodium: 139 mmol/L (ref 135–145)
Total Bilirubin: 0.8 mg/dL (ref 0.3–1.2)
Total Protein: 6.2 g/dL — ABNORMAL LOW (ref 6.5–8.1)

## 2020-05-15 LAB — FERRITIN: Ferritin: 1332 ng/mL — ABNORMAL HIGH (ref 24–336)

## 2020-05-15 LAB — D-DIMER, QUANTITATIVE: D-Dimer, Quant: 0.27 ug/mL-FEU (ref 0.00–0.50)

## 2020-05-15 LAB — C-REACTIVE PROTEIN: CRP: 1 mg/dL — ABNORMAL HIGH (ref <1.0)

## 2020-05-15 MED ORDER — ALBUTEROL SULFATE HFA 108 (90 BASE) MCG/ACT IN AERS: 2.0000 | INHALATION_SPRAY | Freq: Four times a day (QID) | RESPIRATORY_TRACT | 2 refills | Status: AC | PRN

## 2020-05-15 MED ORDER — ASCORBIC ACID 500 MG PO TABS: 500.0000 mg | ORAL_TABLET | Freq: Every day | ORAL | 0 refills | Status: AC

## 2020-05-15 MED ORDER — ZINC SULFATE 220 (50 ZN) MG PO CAPS: 220.0000 mg | ORAL_CAPSULE | Freq: Every day | ORAL | 0 refills | Status: AC

## 2020-05-15 MED ORDER — PREDNISONE 20 MG PO TABS: 40.0000 mg | ORAL_TABLET | Freq: Every day | ORAL | 0 refills | Status: AC

## 2020-05-15 MED ORDER — GUAIFENESIN-DM 100-10 MG/5ML PO SYRP: 10.0000 mL | ORAL_SOLUTION | ORAL | 0 refills | Status: AC | PRN

## 2020-05-15 NOTE — Progress Notes (Signed)
SATURATION QUALIFICATIONS: (This note is used to comply with regulatory documentation for home oxygen)  Patient Saturations on Room Air at Rest = 87-88%  Patient Saturations on Room Air while Ambulating = 86%  Patient Saturations on 2 Liters of oxygen while Ambulating = 95%  Please briefly explain why patient needs home oxygen:  Pt's oxygenation on room air at rest is 87-88% and slightly decreases while ambulating on room air.

## 2020-05-15 NOTE — Progress Notes (Signed)
Eric Watkins was seen and treated at [facility] from 05/11/2020 to 05/15/2020. He may return to work on 05/19/2020 unless he becomes symptomatic again.    Ruel Favors, RN

## 2020-05-15 NOTE — TOC Transition Note (Signed)
Transition of Care [facility]) - CM/SW Discharge Note   Patient Details  Name: Eric Watkins MRN: [id number] Date of Birth: [birth year]  [facility] CM/SW Contact:  Natasha Bence, LCSW Phone Number: 05/15/2020, 11:47 AM   Clinical Narrative:    CSW received order for oxygen for patient. CSW contacted [employer] to provide oxygen. [employer] agreeable to provide oxygen for patient. TOC signing off.                Social Determinants of Health (SDOH) Interventions     Readmission Risk Interventions No flowsheet data found.

## 2020-05-15 NOTE — Discharge Summary (Signed)
Physician Discharge Summary  Eric Watkins:[id number] DOB: [birth year] DOA: 05/11/2020  PCP: [facility]  Admit date: 05/11/2020  Discharge date: 05/15/2020  Admitted From:  Disposition:    Recommendations for Outpatient Follow-up:  1. Follow up with PCP in 1-2 weeks 2. Please obtain BMP/CBC in one week and check LFTs to ensure downward trend. 3. Avoid further Tylenol for now. 4. Continue on prednisone as prescribed for 6 more days to complete course of treatment 5. Continue on oxygen 2 L nasal cannula as prescribed until saturations improve 6. Continue on antitussives as well as breathing treatments as prescribed as needed 7. Continue on vitamin C and zinc as prescribed  Home Health: None  Equipment/Devices: 2 L nasal cannula oxygen  Discharge Condition: Stable  CODE STATUS: Full  Diet recommendation: Heart Healthy  Brief/Interim Summary: 34 y.o.malewith medical history significant ofprevious diagnosis of non-Hodgkin's lymphoma, currently in remission, presents to the hospital with shortness of breath. Reports onset of symptoms approximately 6 days ago with onset of back pain. Patient was having intermittent episodes of back pain which was nonradiating. He denied any falls or heavy lifting. He has had issues with back pain over the years which has been intermittent. His symptoms progressed to include nonproductive cough, generalized weakness and nasal congestion. Denies any loss of smell, but feels that his sense of taste has been altered. This morning, he began to feel short of breath which is what prompted his ER visit. The patient has not been vaccinated for COVID-19.  Patient has undergone a full 5-day treatment with remdesivir and steroids during inpatient stay and his oxygen levels have improved considerably.  He still continues to require 2 L nasal cannula oxygen on discharge and this will be arranged for him.  He will continue on steroids as  noted above.  He was also noted to have Tylenol overdose on admission with elevated transaminases which have now decreased.  He will need continued follow-up outpatient to ensure that these levels have normalized.  He will continue to remain in quarantine as discussed.  Work note will be provided.  No other acute events noted throughout the course of this admission.  He is otherwise stable for discharge today.  Discharge Diagnoses:  Active Problems:   Acute respiratory failure with hypoxia (HCC)   Pneumonia due to COVID-19 virus   Transaminitis   Acute respiratory failure due to COVID-19 [facility])   Acetaminophen overdose, accidental or unintentional, initial encounter    Discharge Instructions  Discharge Instructions    Diet - low sodium heart healthy   Complete by: As directed    Increase activity slowly   Complete by: As directed      Allergies as of 05/15/2020   No Known Allergies     Medication List    STOP taking these medications   acetaminophen 500 MG tablet Commonly known as: TYLENOL     TAKE these medications   albuterol 108 (90 Base) MCG/ACT inhaler Commonly known as: VENTOLIN HFA Inhale 2 puffs into the lungs every 6 (six) hours as needed for wheezing or shortness of breath.   ascorbic acid 500 MG tablet Commonly known as: VITAMIN C Take 1 tablet (500 mg total) by mouth daily. Start taking on: May 16, 2020   guaiFENesin-dextromethorphan 100-10 MG/5ML syrup Commonly known as: ROBITUSSIN DM Take 10 mLs by mouth every 4 (four) hours as needed for cough.   predniSONE 20 MG tablet Commonly known as: Deltasone Take 2 tablets (40 mg total)  by mouth daily for 6 days.   zinc sulfate 220 (50 Zn) MG capsule Take 1 capsule (220 mg total) by mouth daily. Start taking on: May 16, 2020            Durable Medical Equipment  (From admission, onward)         Start     Ordered   05/15/20 1204  DME Oxygen  Once       Question Answer Comment  Length of Need 6  Months   Mode or (Route) Nasal cannula   Liters per Minute 2   Frequency Continuous (stationary and portable oxygen unit needed)   Oxygen conserving device Yes   Oxygen delivery system Gas      05/15/20 1204          Follow-up Information    [facility] [place] Follow up in 2 week(s).   Specialty: Family Medicine Contact information: [place] [address] [phone]              No Known Allergies  Consultations:  None   Procedures/Studies: DG Chest Portable 1 View  Result Date: 05/11/2020 CLINICAL DATA:  Generalized body aches, lower back pain, painful cough EXAM: PORTABLE CHEST 1 VIEW COMPARISON:  12/11/2013 FINDINGS: The heart size and mediastinal contours are within normal limits. Bibasilar predominant heterogeneous airspace opacity throughout the lungs. The visualized skeletal structures are unremarkable. IMPRESSION: Bibasilar predominant heterogeneous airspace opacity throughout the lungs, concerning for multifocal infection, including COVID-19 if clinically suspected. Electronically Signed   By: Eddie Candle M.D.   On: 05/11/2020 10:07   US Abdomen Limited RUQ  Result Date: 05/11/2020 CLINICAL DATA:  Elevated liver function tests. EXAM: ULTRASOUND ABDOMEN LIMITED RIGHT UPPER QUADRANT COMPARISON:  Abdominal ultrasound 07/27/2010. FINDINGS: Gallbladder: No gallstones or wall thickening visualized. No sonographic Murphy sign noted by sonographer. Common bile duct: Diameter: 0.3 cm Liver: No focal lesion identified. Within normal limits in parenchymal echogenicity. Portal vein is patent on color Doppler imaging with normal direction of blood flow towards the liver. Other: None. IMPRESSION: Normal examination. Electronically Signed   By: Inge Rise M.D.   On: 05/11/2020 14:18    Discharge Exam: Vitals:   05/15/20 1000 05/15/20 1100  BP: (!) 145/81 (!) 138/73  Pulse: 78 70  Resp: (!) 25 20  Temp:    SpO2: (!) 88% 93%    Vitals:   05/15/20 0800 05/15/20 0900 05/15/20 1000 05/15/20 1100  BP: (!) 135/86 (!) 152/96 (!) 145/81 (!) 138/73  Pulse: 58 57 78 70  Resp: 20 21 (!) 25 20  Temp:      TempSrc:      SpO2: 94% 96% (!) 88% 93%  Weight:      Height:        General: Pt is alert, awake, not in acute distress Cardiovascular: RRR, S1/S2 +, no rubs, no gallops Respiratory: CTA bilaterally, no wheezing, no rhonchi, currently on 2 L nasal cannula oxygen Abdominal: Soft, NT, ND, bowel sounds + Extremities: no edema, no cyanosis    The results of significant diagnostics from this hospitalization (including imaging, microbiology, ancillary and laboratory) are listed below for reference.     Microbiology: Recent Results (from the past 240 hour(s))  Blood Culture (routine x 2)     Status: None (Preliminary result)   Collection Time: 05/11/20 10:39 AM   Specimen: BLOOD  Result Value Ref Range Status   Specimen Description BLOOD LEFT ANTECUBITAL  Final   Special  Requests   Final    BOTTLES DRAWN AEROBIC AND ANAEROBIC Blood Culture adequate volume   Culture   Final    NO GROWTH 3 DAYS Performed at [facility], [address], [place], [place] [address]    Report Status PENDING  Incomplete  Blood Culture (routine x 2)     Status: None (Preliminary result)   Collection Time: 05/11/20 10:46 AM   Specimen: BLOOD  Result Value Ref Range Status   Specimen Description BLOOD RIGHT ANTECUBITAL  Final   Special Requests   Final    BOTTLES DRAWN AEROBIC AND ANAEROBIC Blood Culture adequate volume   Culture   Final    NO GROWTH 3 DAYS Performed at [facility], [address] [place], [place] [address]    Report Status PENDING  Incomplete  MRSA PCR Screening     Status: None   Collection Time: 05/12/20  3:16 PM   Specimen: Nasal Mucosa; Nasopharyngeal  Result Value Ref Range Status   MRSA by PCR NEGATIVE NEGATIVE Final    Comment:        The GeneXpert MRSA Assay (FDA approved for NASAL  specimens only), is one component of a comprehensive MRSA colonization surveillance program. It is not intended to diagnose MRSA infection nor to guide or monitor treatment for MRSA infections. Performed at [facility], [address], [place], [place] [address]      Labs: BNP (last 3 results) No results for input(s): BNP in the last 8760 hours. Basic Metabolic Panel: Recent Labs  Lab 05/11/20 0946 05/12/20 0438 05/13/20 0503 05/14/20 0515 05/15/20 0349  NA 139 139 140 141 139  K 3.5 3.9 3.4* 4.1 4.3  CL 101 101 102 105 105  CO2 26 26 26 27 25   GLUCOSE 109* 171* 155* 131* 120*  BUN 12 12 16 19 17   CREATININE 0.80 0.60* 0.59* 0.61 0.60*  CALCIUM 8.7* 8.9 8.9 8.9 8.7*  MG  --   --   --  2.2  --    Liver Function Tests: Recent Labs  Lab 05/12/20 0438 05/12/20 1729 05/13/20 0503 05/14/20 0515 05/15/20 0349  AST 238* 204* 158* 75* 43*  ALT 357* 350* 343* 269* 213*  ALKPHOS 75 76 76 65 67  BILITOT 0.9 0.9 0.9 0.7 0.8  PROT 7.0 6.9 7.0 6.1* 6.2*  ALBUMIN 3.5 3.4* 3.3* 3.1* 3.3*   No results for input(s): LIPASE, AMYLASE in the last 168 hours. No results for input(s): AMMONIA in the last 168 hours. CBC: Recent Labs  Lab 05/11/20 0946 05/15/20 0349  WBC 7.4 7.2  NEUTROABS 6.3  --   HGB 15.3 13.9  HCT 43.4 40.7  MCV 91.9 94.7  PLT 202 324   Cardiac Enzymes: No results for input(s): CKTOTAL, CKMB, CKMBINDEX, TROPONINI in the last 168 hours. BNP: Invalid input(s): POCBNP CBG: No results for input(s): GLUCAP in the last 168 hours. D-Dimer Recent Labs    05/14/20 0515 05/15/20 0349  DDIMER 0.27 0.27   Hgb A1c No results for input(s): HGBA1C in the last 72 hours. Lipid Profile No results for input(s): CHOL, HDL, LDLCALC, TRIG, CHOLHDL, LDLDIRECT in the last 72 hours. Thyroid function studies No results for input(s): TSH, T4TOTAL, T3FREE, THYROIDAB in the last 72 hours.  Invalid input(s): FREET3 Anemia work up Recent Labs    05/14/20 0515  05/15/20 0349  FERRITIN 1,746* 1,332*   Urinalysis    Component Value Date/Time   COLORURINE AMBER BIOCHEMICALS MAY BE AFFECTED BY COLOR (A) 07/26/2010 1748  APPEARANCEUR TURBID (A) 07/26/2010 1748   LABSPEC 1.025 07/26/2010 1748   PHURINE 5.5 07/26/2010 1748   GLUCOSEU NEGATIVE 07/26/2010 1748   HGBUR MODERATE (A) 07/26/2010 1748   BILIRUBINUR SMALL (A) 07/26/2010 1748   KETONESUR NEGATIVE 07/26/2010 1748   PROTEINUR 100 (A) 07/26/2010 1748   UROBILINOGEN 1.0 07/26/2010 1748   NITRITE POSITIVE (A) 07/26/2010 1748   LEUKOCYTESUR SMALL (A) 07/26/2010 1748   Sepsis Labs Invalid input(s): PROCALCITONIN,  WBC,  LACTICIDVEN Microbiology Recent Results (from the past 240 hour(s))  Blood Culture (routine x 2)     Status: None (Preliminary result)   Collection Time: 05/11/20 10:39 AM   Specimen: BLOOD  Result Value Ref Range Status   Specimen Description BLOOD LEFT ANTECUBITAL  Final   Special Requests   Final    BOTTLES DRAWN AEROBIC AND ANAEROBIC Blood Culture adequate volume   Culture   Final    NO GROWTH 3 DAYS Performed at [facility], [address], [place], [place] [address]    Report Status PENDING  Incomplete  Blood Culture (routine x 2)     Status: None (Preliminary result)   Collection Time: 05/11/20 10:46 AM   Specimen: BLOOD  Result Value Ref Range Status   Specimen Description BLOOD RIGHT ANTECUBITAL  Final   Special Requests   Final    BOTTLES DRAWN AEROBIC AND ANAEROBIC Blood Culture adequate volume   Culture   Final    NO GROWTH 3 DAYS Performed at [facility], [address] [place], [place] [address]    Report Status PENDING  Incomplete  MRSA PCR Screening     Status: None   Collection Time: 05/12/20  3:16 PM   Specimen: Nasal Mucosa; Nasopharyngeal  Result Value Ref Range Status   MRSA by PCR NEGATIVE NEGATIVE Final    Comment:        The GeneXpert MRSA Assay (FDA approved for NASAL specimens only), is one component of a comprehensive MRSA  colonization surveillance program. It is not intended to diagnose MRSA infection nor to guide or monitor treatment for MRSA infections. Performed at [facility], [address] [place], [place] [address]      Time coordinating discharge: 35 minutes  SIGNED:   Rodena Goldmann, DO [facility] 05/15/2020, 12:11 PM  If 7PM-7AM, please contact night-coverage [email or web address]

## 2020-05-16 LAB — CULTURE, BLOOD (ROUTINE X 2)
Culture: NO GROWTH
Culture: NO GROWTH
Special Requests: ADEQUATE
Special Requests: ADEQUATE
